# Patient Record
Sex: Male | Born: 1965 | Race: White | Hispanic: No | Marital: Married | Smoking: Never smoker
Health system: Southern US, Community
[De-identification: ages and names within clinical notes are randomized; demographics above are authoritative.]

## PROBLEM LIST (undated history)

## (undated) DIAGNOSIS — E7800 Pure hypercholesterolemia, unspecified: Secondary | ICD-10-CM

## (undated) DIAGNOSIS — I10 Essential (primary) hypertension: Secondary | ICD-10-CM

## (undated) HISTORY — PX: HERNIA REPAIR: SHX51

## (undated) HISTORY — PX: OTHER SURGICAL HISTORY: SHX169

---

## 2007-06-30 ENCOUNTER — Ambulatory Visit: Payer: Self-pay | Admitting: Emergency Medicine

## 2007-10-12 ENCOUNTER — Ambulatory Visit: Payer: Self-pay | Admitting: Internal Medicine

## 2008-02-24 IMAGING — CR RIGHT ELBOW - COMPLETE 3+ VIEW
1 series · 4 of 4 positions shown · non-contrast
Comparison: none

REASON FOR EXAM: swollen painful right elbow twisted
COMMENTS:   LMP: (Male)

PROCEDURE:     MDR - MDR ELBOW RT COMP W/ OBLIQUES  - June 30, 2007  [DATE]
RESULT:     No fracture, dislocation or other acute bony abnormality is
identified. Incidentally noted is a posterior calcaneal spur compatible with
calcification at tendon insertion.

[Series 1: view not recorded · 0.17mm/px · 4 of 4 slices shown]
[im 1/4]
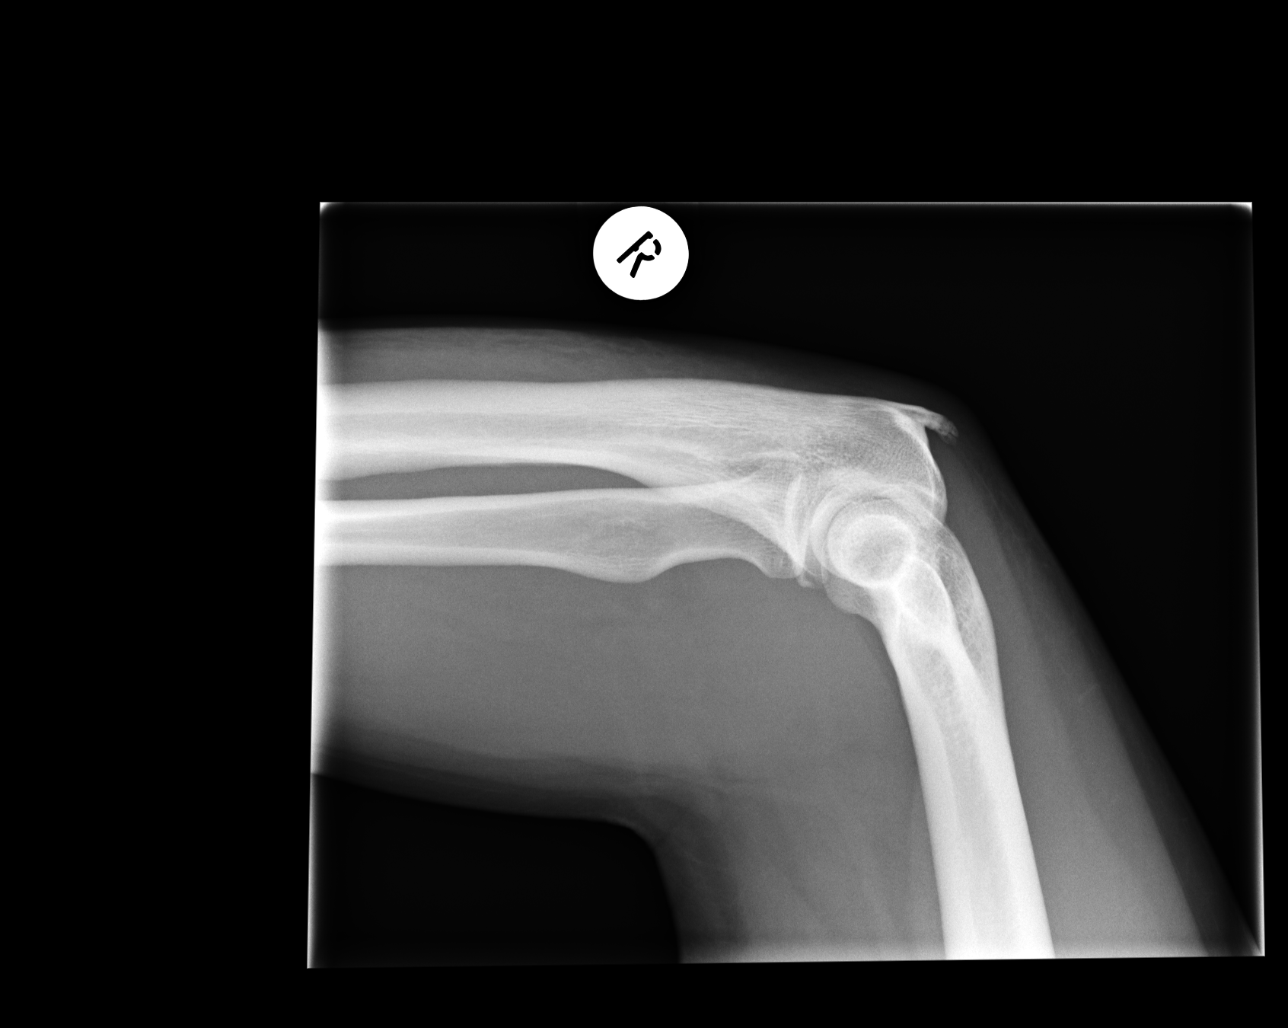
[im 2/4]
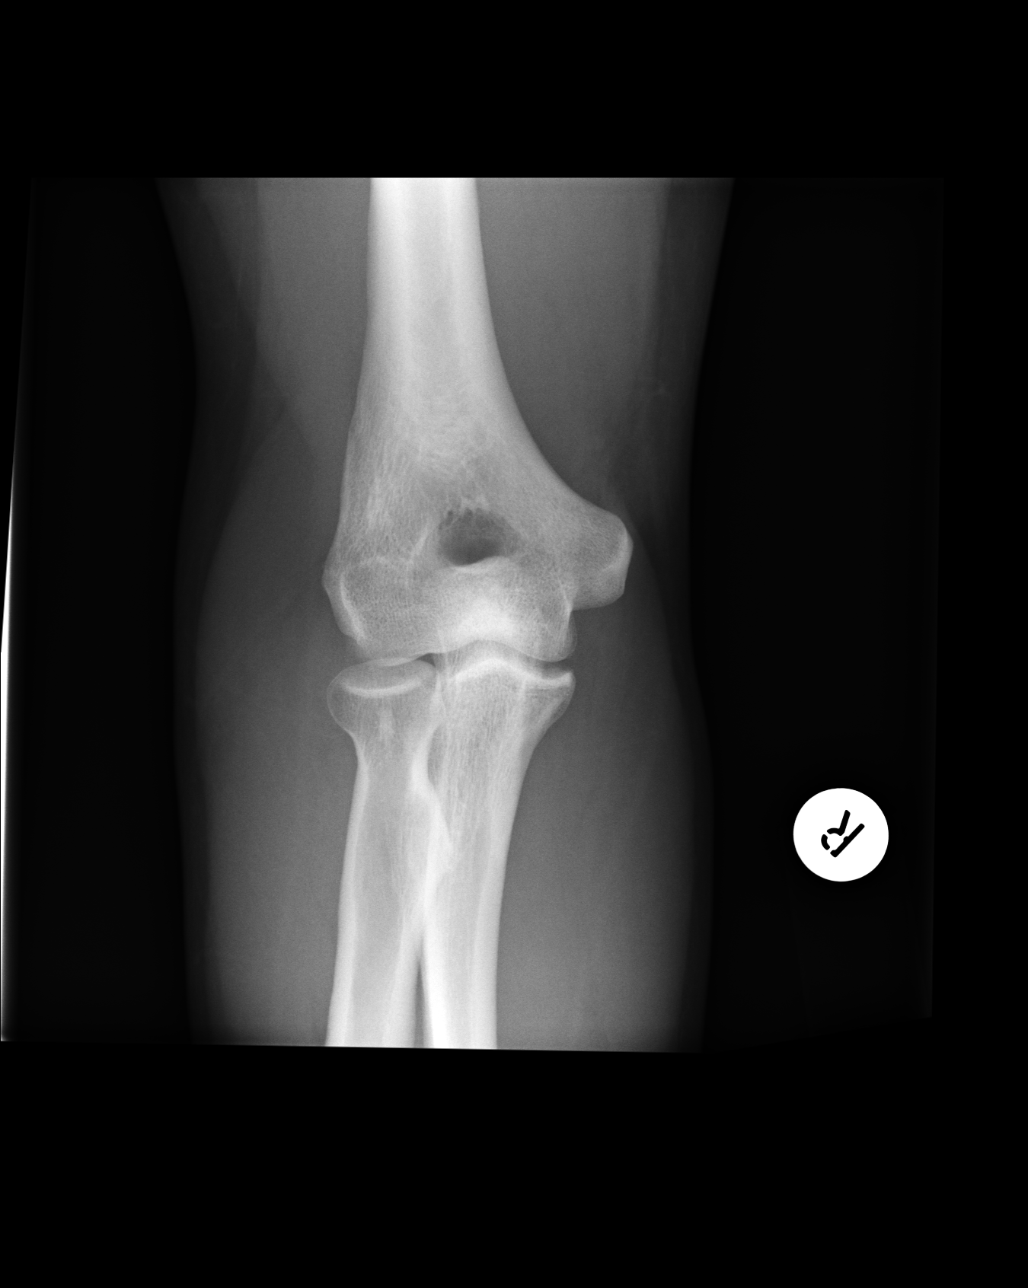
[im 3/4]
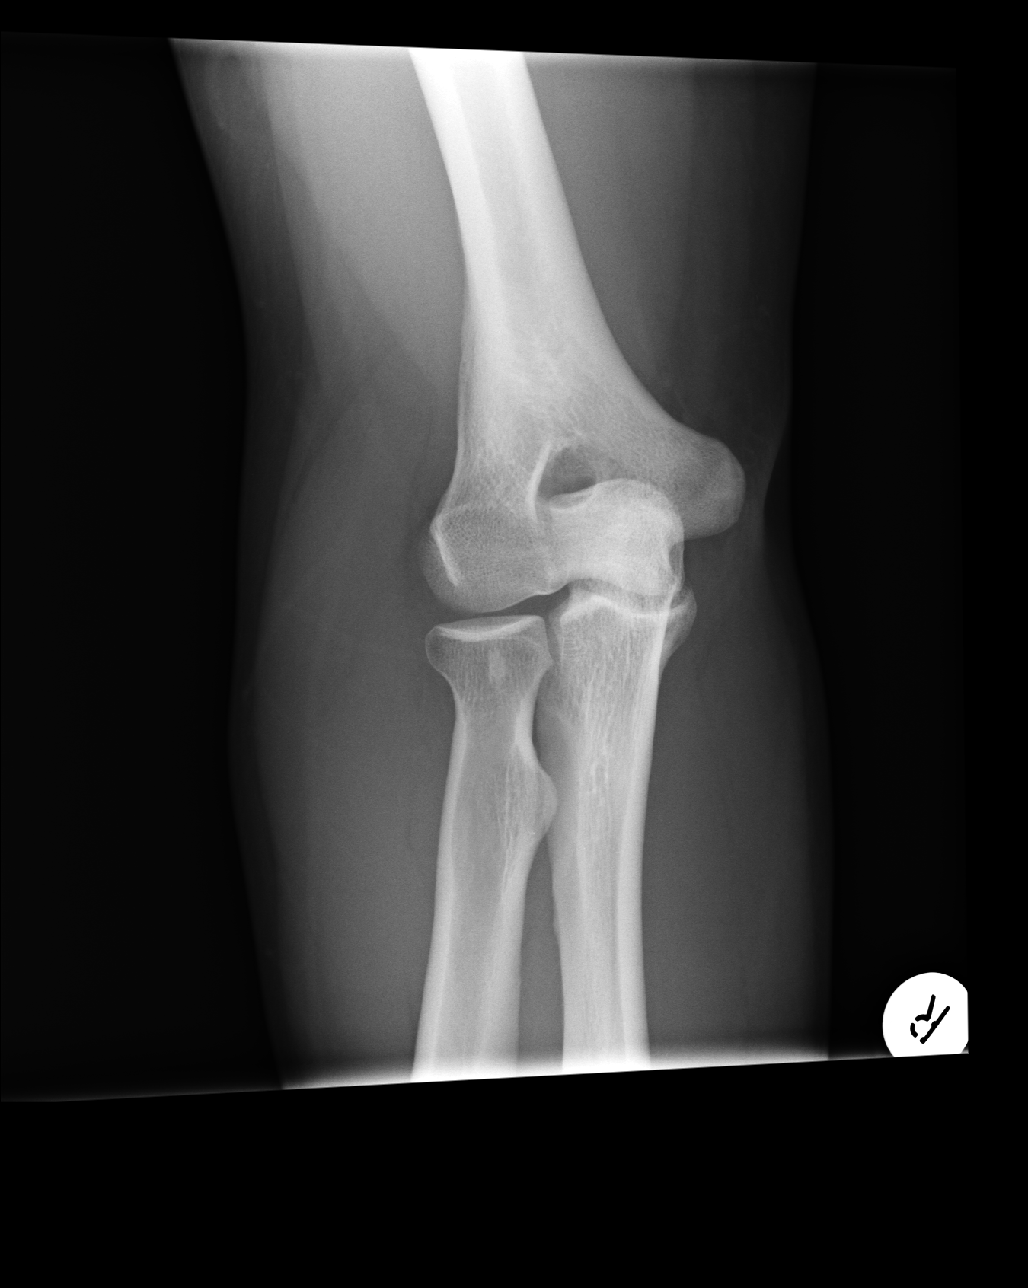
[im 4/4]
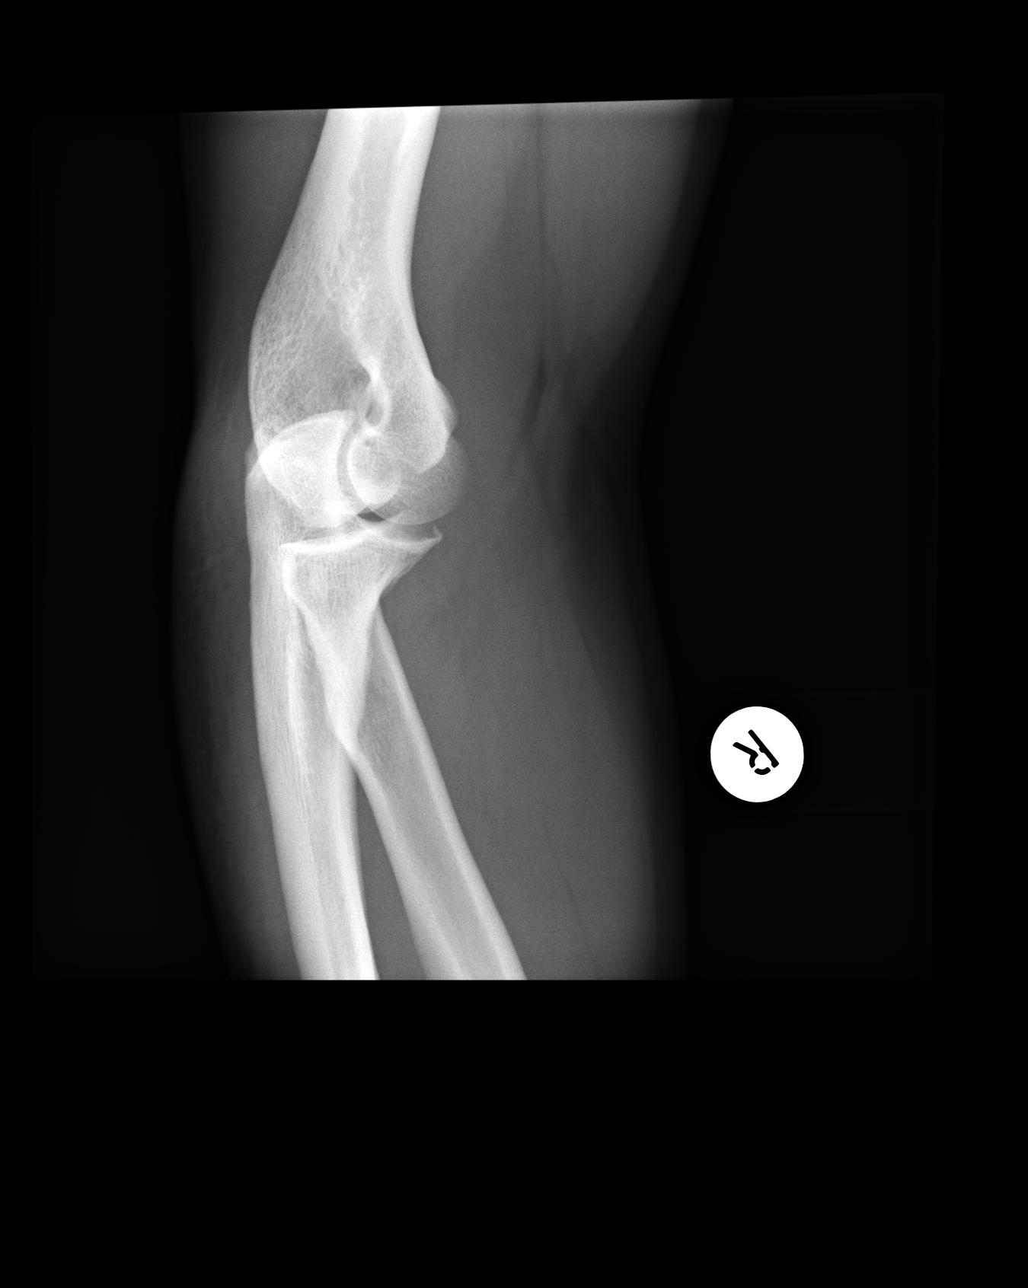

[4 of 4 positions shown; findings below may reference images not displayed]

IMPRESSION: 1.     No acute bony abnormalities are noted.

## 2011-12-31 DIAGNOSIS — F329 Major depressive disorder, single episode, unspecified: Secondary | ICD-10-CM | POA: Insufficient documentation

## 2011-12-31 DIAGNOSIS — F1010 Alcohol abuse, uncomplicated: Secondary | ICD-10-CM | POA: Insufficient documentation

## 2012-03-19 ENCOUNTER — Ambulatory Visit: Payer: Self-pay | Admitting: Urology

## 2012-11-28 DIAGNOSIS — E669 Obesity, unspecified: Secondary | ICD-10-CM | POA: Insufficient documentation

## 2012-11-28 DIAGNOSIS — N400 Enlarged prostate without lower urinary tract symptoms: Secondary | ICD-10-CM | POA: Insufficient documentation

## 2012-12-12 ENCOUNTER — Ambulatory Visit: Payer: Self-pay | Admitting: Urology

## 2013-03-16 DIAGNOSIS — R079 Chest pain, unspecified: Secondary | ICD-10-CM | POA: Insufficient documentation

## 2013-11-30 ENCOUNTER — Other Ambulatory Visit: Payer: Self-pay

## 2013-12-05 LAB — CULTURE, BLOOD (SINGLE)

## 2014-06-04 DIAGNOSIS — I10 Essential (primary) hypertension: Secondary | ICD-10-CM | POA: Insufficient documentation

## 2014-06-04 DIAGNOSIS — R748 Abnormal levels of other serum enzymes: Secondary | ICD-10-CM | POA: Insufficient documentation

## 2014-10-05 DIAGNOSIS — M1A0720 Idiopathic chronic gout, left ankle and foot, without tophus (tophi): Secondary | ICD-10-CM | POA: Insufficient documentation

## 2015-02-25 NOTE — H&P (Signed)
PATIENT NAME:  Jimmy Noble, Jimmy Noble MR#:  [id number] DATE OF BIRTH:  15-Jun-1966  DATE OF ADMISSION:  12/12/2012  HISTORY OF PRESENT ILLNESS:  The patient presents with urgency, frequency and hesitation of urination, difficulty voiding, change in urinary habits.  These are symptoms of his previous meatal stenosis and he has chronic meatal stenosis, which he has failed to come in for correction.  He was seen by Dr. Ernst Spell 1 year ago, had a cystoscopy. Urinalysis today revealed red blood cells and some bacteria.  He has been on antibiotics.  He feels fatigued.  Otherwise, he has no shortness of breath, chest pain. He has no difficulty ambulating.  He has no other problems of swallowing.    REVIEW OF SYSTEMS:  HEENT:  His sight is good, hearing is good.  He has no nasal stuffiness.   CHEST:  He has no chest pain, shortness of breath on exertion.  LUNGS:  He has no COPD, no hemoptysis.   ABDOMEN:  He has no abdominal pain, constipation, diarrhea, vomiting.  PENIS:  The same as the HPI.   SKIN:  He has no history of skin lesions, burns or surgery for skin cancer.    PHYSICAL EXAMINATION: VITAL SIGNS:  Weight 251, pulse 90, BP 125/85.  GENERAL:  Well-developed, well-nourished, in no acute distress.  Alert and oriented x 3.  NECK:  Supple and no palpable masses.  Normal thyroid size and consistency.  No thyroid masses or tenderness.   RESPIRATORY:  Effort is quiet even with the use of accessory muscles on auscultation of the lungs.  Normal breath sounds and no rales, rhonchi or wheezing.  CARDIAC:  Heart sounds are regular rate and rhythm.  There are no gross murmurs, rubs or thrills.  PENIS:  Circumcised, no warts.  Severe meatal stenosis easily seen.    PLAN:  He was placed on Ceftin 250 mg b.i.d.  I will do a meatotomy in the morning.  I will not do a cystoscopy because he had one done and he has some bacteria in his urine.  I think discretion is the better part of valor on this case.  He has no history of  stricture or disease on recent cystoscopy in June of last year.  I have explained the procedure to the patient and that he will have 2 sutures laterally, probably 3-0 Vicryl.  These will stay in for at least a week.  He will be seen in followup 10 days postop.  We will remove the sutures then if they have not come out.  A generous incision will be made in the ventral portion of his urethra.  He understands this.  He understands that he will asleep for this procedure.    ____________________________ Janice Coffin. Elnoria Howard, [place] rdh:cc D: 12/11/2012 15:25:57 ET T: 12/11/2012 17:00:12 ET JOB#: [id number]  cc: Janice Coffin. Elnoria Howard, DO, <Dictator> RICHARD D HART DO ELECTRONICALLY SIGNED 12/26/2012 15:44

## 2015-02-25 NOTE — Op Note (Signed)
PATIENT NAME:  Jimmy Noble, Jimmy Noble MR#:  [id number] DATE OF BIRTH:  07-31-1966  DATE OF PROCEDURE:  12/12/2012  PREOPERATIVE DIAGNOSIS:  Severe urethral meatal stenosis.  POSTOPERATIVE DIAGNOSIS:  Severe urethral meatal stenosis.  PROCEDURE:  Urethral meatotomy.    ANESTHESIA:  General.  COMPLICATIONS:  None.  TECHNIQUE:  With the patient sterilely prepped and draped in the supine position, the penis is anesthetized in the ventral position utilizing 1% Xylocaine.  In this anesthetized portion ventrally I am able to clamp the anesthetized area at the ventral portion of the lower portion of the urethral meatus and placed 2 silk sutures laterally along the clamp which is a very nice elongated pointed needle driver for proper pressure on the glans tissue.  The sutures are 4-0 Vicryl and they are sutured laterally.  Then I take the clamp off, held the urethra open and incised the now clamped portion.  There is a little more bleeding than I usually expect.  I cauterized that easily.  The patient sent to recovery in satisfactory condition with the sutures holding the meatus open laterally.  2% lidocaine gel was placed on the wound areas for decrease in the pain on voiding.  Then he is able to be sent to recovery in satisfactory condition.    ____________________________ Janice Coffin. Elnoria Howard, DO rdh:ea D: 12/12/2012 14:28:43 ET T: 12/13/2012 03:29:27 ET JOB#: [id number]  cc: Janice Coffin. Lynnell Chad, <Dictator> Outside Physician Dayanna Pryce D Alphonso Gregson DO ELECTRONICALLY SIGNED 12/26/2012 15:44

## 2016-04-16 ENCOUNTER — Ambulatory Visit
Admission: EM | Admit: 2016-04-16 | Discharge: 2016-04-16 | Discharge status: Absent without leave | Payer: BLUE CROSS/BLUE SHIELD

## 2016-05-29 ENCOUNTER — Ambulatory Visit (INDEPENDENT_AMBULATORY_CARE_PROVIDER_SITE_OTHER): Payer: BLUE CROSS/BLUE SHIELD | Admitting: Urology

## 2016-05-29 ENCOUNTER — Encounter: Payer: Self-pay | Admitting: Urology

## 2016-05-29 VITALS — BP 144/84 | HR 76 | Ht 72.0 in | Wt 239.1 lb

## 2016-05-29 DIAGNOSIS — N411 Chronic prostatitis: Secondary | ICD-10-CM | POA: Diagnosis not present

## 2016-05-29 LAB — BLADDER SCAN AMB NON-IMAGING: SCAN RESULT: 22

## 2016-05-29 NOTE — Progress Notes (Signed)
   05/29/2016 3:13 PM   Katheren Puller 12-04-1965 [id number]  Referring provider: No referring provider defined for this encounter.  Chief Complaint  Patient presents with  . Follow-up    prostatitis    HPI: Jimmy Noble is a 50yo seen today for recurrent prostatitis.  He has had 2 episodes of acute prostatitis in the past 6 months. He saw his PCP who recently placed him on cipro and flomax. He stopped the medications due to lightheadedness and nausea. He has urgency, frequency, nocturia 3-4x, feeling of incomplete emptying.  He drinks 20oz within 2 hours of bedtime.  He has a hx of meatal stenosis and underwent meatotomy by Dr. Elnoria Howard in 2014.    PMH: No past medical history on file.  Surgical History: No past surgical history on file.  Home Medications:    Medication List       Accurate as of 05/29/16  3:13 PM. Always use your most recent med list.          atorvastatin 10 MG tablet Commonly known as:  LIPITOR   citalopram 20 MG tablet Commonly known as:  CELEXA TAKE 1/2 TABLET(10 MG) BY MOUTH EVERY DAY   lisinopril 10 MG tablet Commonly known as:  PRINIVIL,ZESTRIL Take by mouth.       Allergies: No Known Allergies  Family History: No family history on file.  Social History:  reports that he has never smoked. He does not have any smokeless tobacco history on file. He reports that he drinks alcohol. He reports that he does not use drugs.  ROS:                                        Physical Exam: BP (!) 144/84 (BP Location: Right Arm, Patient Position: Sitting, Cuff Size: Large)   Pulse 76   Ht 6' (1.829 m)   Wt 108.5 kg (239 lb 1.6 oz)   BMI 32.43 kg/m   Constitutional:  Alert and oriented, No acute distress. HEENT: [place] AT, moist mucus membranes.  Trachea midline, no masses. Cardiovascular: No clubbing, cyanosis, or edema. Respiratory: Normal respiratory effort, no increased work of breathing. GI: Abdomen is soft, nontender,  nondistended, no abdominal masses GU: No CVA tenderness. Circumcised phallus, mild meatal stenosis. No masses/lesions on penis, testes, scrotum. Prostate 30g smooth, tender, boggy. Skin: No rashes, bruises or suspicious lesions. Lymph: No cervical or inguinal adenopathy. Neurologic: Grossly intact, no focal deficits, moving all 4 extremities. Psychiatric: Normal mood and affect.  Laboratory Data: No results found for: WBC, HGB, HCT, MCV, PLT  No results found for: CREATININE  No results found for: PSA  No results found for: TESTOSTERONE  No results found for: HGBA1C  Urinalysis No results found for: COLORURINE, APPEARANCEUR, LABSPEC, PHURINE, GLUCOSEU, HGBUR, BILIRUBINUR, KETONESUR, PROTEINUR, UROBILINOGEN, NITRITE, LEUKOCYTESUR  Pertinent Imaging: none  Assessment & Plan:    1. Chronic prostatitis -bactrim DS BID for 30 days - rapaflo 8mg  qhs - Urinalysis, Complete -RTC 1 month   No Follow-up on file.  Nicolette Bang, MD  [facility] [address] [place],  [address] [phone]

## 2016-05-30 LAB — URINALYSIS, COMPLETE
BILIRUBIN UA: NEGATIVE
Glucose, UA: NEGATIVE
Ketones, UA: NEGATIVE
Nitrite, UA: NEGATIVE
PH UA: 5.5 (ref 5.0–7.5)
Specific Gravity, UA: >1.03 — ABNORMAL HIGH (ref 1.005–1.030)
UUROB: 0.2 mg/dL (ref 0.2–1.0)

## 2016-05-30 LAB — MICROSCOPIC EXAMINATION: Epithelial Cells (non renal): NONE SEEN /hpf (ref 0–10)

## 2016-05-31 LAB — URINE CULTURE

## 2016-06-04 MED ORDER — SULFAMETHOXAZOLE-TRIMETHOPRIM 800-160 MG PO TABS: 1.0000 | ORAL_TABLET | Freq: Two times a day (BID) | ORAL | 0 refills | Status: DC

## 2016-06-04 NOTE — Addendum Note (Signed)
Addended by: Wilson Singer on: 06/04/2016 11:46 AM   Modules accepted: Orders

## 2016-06-07 ENCOUNTER — Other Ambulatory Visit: Payer: Self-pay

## 2016-06-07 DIAGNOSIS — N410 Acute prostatitis: Secondary | ICD-10-CM

## 2016-06-07 MED ORDER — SULFAMETHOXAZOLE-TRIMETHOPRIM 800-160 MG PO TABS: 1.0000 | ORAL_TABLET | Freq: Two times a day (BID) | ORAL | 0 refills | Status: AC

## 2016-06-07 NOTE — Progress Notes (Signed)
Pt wife called stating pt went out of town and forgot abx. Wife requested a 4 day supply be sent to a local pharmacy in [place]. 4 days were sent to [facility]. Wife voiced understanding.

## 2016-07-03 ENCOUNTER — Ambulatory Visit (INDEPENDENT_AMBULATORY_CARE_PROVIDER_SITE_OTHER): Payer: BLUE CROSS/BLUE SHIELD | Admitting: Urology

## 2016-07-03 VITALS — BP 144/92 | HR 83 | Ht 72.0 in | Wt 237.0 lb

## 2016-07-03 DIAGNOSIS — N411 Chronic prostatitis: Secondary | ICD-10-CM | POA: Insufficient documentation

## 2016-07-03 LAB — URINALYSIS, COMPLETE
Bilirubin, UA: NEGATIVE
GLUCOSE, UA: NEGATIVE
KETONES UA: NEGATIVE
Nitrite, UA: NEGATIVE
SPEC GRAV UA: 1.025 (ref 1.005–1.030)
Urobilinogen, Ur: 0.2 mg/dL (ref 0.2–1.0)
pH, UA: 5.5 (ref 5.0–7.5)

## 2016-07-03 LAB — MICROSCOPIC EXAMINATION
Bacteria, UA: NONE SEEN
Epithelial Cells (non renal): NONE SEEN /hpf (ref 0–10)

## 2016-07-03 NOTE — Progress Notes (Signed)
07/03/2016 10:11 AM   Jimmy Noble [birth year] [id number]  Referring provider: Sofie Hartigan, MD [place], [place] [address]  Chief Complaint  Patient presents with  . Follow-up    65month - prostatitis    HPI: Jimmy Noble is a 50yo here for followup for recurent prostatitis. He was previously seen by Dr. Elnoria Howard for meatal stenosis. He was previously on cipro and flomax for prostatitis which did not improve his symptoms. He was then placed on bactrim for 30 days and rapaflo 8mg  qhs at his last visit.  He stopped rapaflo 4 days ago.    UA today is normal   PMH: No past medical history on file.  Surgical History: No past surgical history on file.  Home Medications:    Medication List       Accurate as of 07/03/16 10:11 AM. Always use your most recent med list.          atorvastatin 10 MG tablet Commonly known as:  LIPITOR   citalopram 20 MG tablet Commonly known as:  CELEXA TAKE 1/2 TABLET(10 MG) BY MOUTH EVERY DAY   lisinopril 10 MG tablet Commonly known as:  PRINIVIL,ZESTRIL Take by mouth.       Allergies: No Known Allergies  Family History: No family history on file.  Social History:  reports that he has never smoked. He does not have any smokeless tobacco history on file. He reports that he drinks alcohol. He reports that he does not use drugs.  ROS: UROLOGY Frequent Urination?: No Hard to postpone urination?: No Burning/pain with urination?: No Get up at night to urinate?: No Leakage of urine?: No Urine stream starts and stops?: No Trouble starting stream?: No Do you have to strain to urinate?: No Blood in urine?: No Urinary tract infection?: No Sexually transmitted disease?: No Injury to kidneys or bladder?: No Painful intercourse?: No Weak stream?: No Erection problems?: No Penile pain?: No  Gastrointestinal Nausea?: No Vomiting?: No Indigestion/heartburn?: No Diarrhea?:  No Constipation?: No  Constitutional Fever: No Night sweats?: No Weight loss?: No Fatigue?: No  Skin Skin rash/lesions?: Yes Itching?: No  Eyes Blurred vision?: No Double vision?: No  Ears/Nose/Throat Sore throat?: No Sinus problems?: No  Hematologic/Lymphatic Swollen glands?: No Easy bruising?: No  Cardiovascular Leg swelling?: No Chest pain?: No  Respiratory Cough?: No Shortness of breath?: No  Endocrine Excessive thirst?: No  Musculoskeletal Back pain?: No Joint pain?: No  Neurological Headaches?: No Dizziness?: No  Psychologic Depression?: Yes Anxiety?: No  Physical Exam: BP (!) 144/92   Pulse 83   Ht 6' (1.829 m)   Wt 107.5 kg (237 lb)   BMI 32.14 kg/m   Constitutional:  Alert and oriented, No acute distress. HEENT: [place] AT, moist mucus membranes.  Trachea midline, no masses. Cardiovascular: No clubbing, cyanosis, or edema. Respiratory: Normal respiratory effort, no increased work of breathing. GI: Abdomen is soft, nontender, nondistended, no abdominal masses GU: No CVA tenderness. Skin: No rashes, bruises or suspicious lesions. Lymph: No cervical or inguinal adenopathy. Neurologic: Grossly intact, no focal deficits, moving all 4 extremities. Psychiatric: Normal mood and affect.  Laboratory Data: No results found for: WBC, HGB, HCT, MCV, PLT  No results found for: CREATININE  No results found for: PSA  No results found for: TESTOSTERONE  No results found for: HGBA1C  Urinalysis    Component Value Date/Time   APPEARANCEUR Haz[place] (A) 05/29/2016 1518   GLUCOSEU Negative 05/29/2016 1518  BILIRUBINUR Negative 05/29/2016 1518   PROTEINUR 2+ (A) 05/29/2016 1518   NITRITE Negative 05/29/2016 1518   LEUKOCYTESUR 1+ (A) 05/29/2016 1518    Pertinent Imaging: none  Assessment & Plan:    1. Chronic prostatitis -continue timed voiding -rtc 3 months - Urinalysis, Complete   No Follow-up on file.  Nicolette Bang,  MD  [facility] [address], [place], [place] [address] [phone]

## 2016-09-25 ENCOUNTER — Ambulatory Visit: Payer: BLUE CROSS/BLUE SHIELD

## 2017-12-26 ENCOUNTER — Encounter: Payer: Self-pay | Admitting: Urology

## 2017-12-26 ENCOUNTER — Ambulatory Visit: Payer: BLUE CROSS/BLUE SHIELD | Admitting: Urology

## 2017-12-26 VITALS — BP 148/90 | HR 80 | Ht 72.0 in | Wt 248.1 lb

## 2017-12-26 DIAGNOSIS — N3281 Overactive bladder: Secondary | ICD-10-CM | POA: Diagnosis not present

## 2017-12-26 MED ORDER — MIRABEGRON ER 25 MG PO TB24: 25.0000 mg | ORAL_TABLET | Freq: Every day | ORAL | 11 refills | Status: DC

## 2017-12-26 NOTE — Progress Notes (Signed)
12/26/2017 9:01 AM   Jimmy Noble 1966/06/02 [id number]  Referring provider: Sofie Hartigan, [place], [place] [address]  No chief complaint on file.   HPI: The patient is a 52 year old gentleman who is been seen in our office before for recurrent prostatitis and meatal stenosis status post meatotomy by Dr. Elnoria Howard in 2014  who presents to discuss recent bout of prostatitis.  He was seen recently in the emergency department for flank pain.  He was also having urinary symptoms at that time with an elevated white blood cell count of 16.  He has been on Cipro since November 13, 2017.  He had negative urine culture December 25, 2017.  Since January, the patient states that he has felt "crappy."  His biggest complaint is sweats, diffuse upper back pain, and not feeling well overall.  He denies any change in his urinary symptoms.    He does have baseline urinary symptoms for the last 3 years which include nocturia x5 and urinary urgency with incontinence.  He does feel that he does not empty his bladder.  He has a weak stream.  He he notes frequency as well.  However, he has had no changes to his urinary status during his recent episode of not feeling well.  He has no dysuria.  He has no suprapubic discomfort.  He has no pain in his groin.  PVR: 0 cc   PMH: History reviewed. No pertinent past medical history.  Surgical History: Past Surgical History:  Procedure Laterality Date  . DVIU      Home Medications:  Allergies as of 12/26/2017   No Known Allergies     Medication List        Accurate as of 12/26/17  9:01 AM. Always use your most recent med list.          atorvastatin 10 MG tablet Commonly known as:  LIPITOR   ciprofloxacin 500 MG tablet Commonly known as:  CIPRO Take 500 mg by mouth 2 (two) times daily.   citalopram 20 MG tablet Commonly known as:  CELEXA TAKE 1/2 TABLET(10 MG) BY MOUTH EVERY DAY   lisinopril 10 MG tablet Commonly known as:   PRINIVIL,ZESTRIL Take by mouth.   tamsulosin 0.4 MG Caps capsule Commonly known as:  FLOMAX Take 0.4 mg by mouth.       Allergies: No Known Allergies  Family History: Family History  Problem Relation Age of Onset  . Prostate cancer Neg Hx   . Bladder Cancer Neg Hx   . Kidney cancer Neg Hx     Social History:  reports that  has never smoked. he has never used smokeless tobacco. He reports that he drinks alcohol. He reports that he does not use drugs.  ROS: UROLOGY Frequent Urination?: No Hard to postpone urination?: No Burning/pain with urination?: No Get up at night to urinate?: No Leakage of urine?: No Urine stream starts and stops?: No Trouble starting stream?: No Do you have to strain to urinate?: No Blood in urine?: No Urinary tract infection?: No Sexually transmitted disease?: No Injury to kidneys or bladder?: No Painful intercourse?: No Weak stream?: No Erection problems?: No Penile pain?: No  Gastrointestinal Nausea?: Yes Vomiting?: No Indigestion/heartburn?: No Diarrhea?: No Constipation?: No  Constitutional Fever: No Night sweats?: Yes Weight loss?: No Fatigue?: No  Skin Skin rash/lesions?: No Itching?: No  Eyes Blurred vision?: Yes Double vision?: No  Ears/Nose/Throat Sore throat?: No Sinus problems?: No  Hematologic/Lymphatic Swollen glands?:  No Easy bruising?: No  Cardiovascular Leg swelling?: No Chest pain?: No  Respiratory Cough?: No Shortness of breath?: No  Endocrine Excessive thirst?: No  Musculoskeletal Back pain?: Yes Joint pain?: No  Neurological Headaches?: Yes Dizziness?: Yes  Psychologic Depression?: Yes Anxiety?: Yes  Physical Exam: BP (!) 148/90 (BP Location: Right Arm, Patient Position: Sitting, Cuff Size: Large)   Pulse 80   Ht 6' (1.829 m)   Wt 248 lb 1.6 oz (112.5 kg)   BMI 33.65 kg/m   Constitutional:  Alert and oriented, No acute distress. HEENT: [place] AT, moist mucus membranes.  Trachea  midline, no masses. Cardiovascular: No clubbing, cyanosis, or edema. Respiratory: Normal respiratory effort, no increased work of breathing. GI: Abdomen is soft, nontender, nondistended, no abdominal masses GU: No CVA tenderness.  DRE: 2+ smooth benign Skin: No rashes, bruises or suspicious lesions. Lymph: No cervical or inguinal adenopathy. Neurologic: Grossly intact, no focal deficits, moving all 4 extremities. Psychiatric: Normal mood and affect.  Laboratory Data: No results found for: WBC, HGB, HCT, MCV, PLT  No results found for: CREATININE  No results found for: PSA  No results found for: TESTOSTERONE  No results found for: HGBA1C  Urinalysis    Component Value Date/Time   APPEARANCEUR Clear 07/03/2016 0956   GLUCOSEU Negative 07/03/2016 0956   BILIRUBINUR Negative 07/03/2016 0956   PROTEINUR 2+ (A) 07/03/2016 0956   NITRITE Negative 07/03/2016 0956   LEUKOCYTESUR Trace (A) 07/03/2016 0956    Assessment & Plan:    1.  Overactive bladder The patient does have symptoms of long-standing overactive bladder.  We will try him on Myrbetriq 25 mg daily.  We can start him on an anticholinergic if this is cost prohibitive.  2.  Recurrent prostatitis The patient has no signs or symptoms suggesting current prostatitis  3.  Fatigue, sweats, diffuse back pain There is no urological explanation for this.  I recommended to the patient that he follow-up with his primary care provider to discuss further.  Return in about 3 months (around 03/25/2018).  Nickie Retort, MD  [facility] [address] [place], [place] [address] [phone]

## 2018-11-25 ENCOUNTER — Other Ambulatory Visit: Payer: Self-pay

## 2018-11-25 DIAGNOSIS — Z1211 Encounter for screening for malignant neoplasm of colon: Secondary | ICD-10-CM

## 2018-11-25 DIAGNOSIS — I10 Essential (primary) hypertension: Secondary | ICD-10-CM | POA: Diagnosis not present

## 2018-11-25 DIAGNOSIS — Z0000 Encounter for general adult medical examination without abnormal findings: Secondary | ICD-10-CM | POA: Diagnosis not present

## 2018-11-25 DIAGNOSIS — R7302 Impaired glucose tolerance (oral): Secondary | ICD-10-CM | POA: Diagnosis not present

## 2018-11-25 DIAGNOSIS — E7800 Pure hypercholesterolemia, unspecified: Secondary | ICD-10-CM | POA: Diagnosis not present

## 2018-11-25 MED ORDER — NA SULFATE-K SULFATE-MG SULF 17.5-3.13-1.6 GM/177ML PO SOLN: 1.0000 | Freq: Once | ORAL | 0 refills | Status: AC

## 2018-12-02 ENCOUNTER — Other Ambulatory Visit: Payer: Self-pay | Admitting: Family Medicine

## 2018-12-02 ENCOUNTER — Encounter: Payer: Self-pay | Admitting: *Deleted

## 2018-12-02 ENCOUNTER — Other Ambulatory Visit: Payer: Self-pay

## 2018-12-02 DIAGNOSIS — Z0000 Encounter for general adult medical examination without abnormal findings: Secondary | ICD-10-CM

## 2018-12-02 DIAGNOSIS — F1010 Alcohol abuse, uncomplicated: Secondary | ICD-10-CM

## 2018-12-04 NOTE — Discharge Instructions (Signed)
General Anesthesia, Adult, Care After  This sheet gives you information about how to care for yourself after your procedure. Your health care provider may also give you more specific instructions. If you have problems or questions, contact your health care provider.  What can I expect after the procedure?  After the procedure, the following side effects are common:  Pain or discomfort at the IV site.  Nausea.  Vomiting.  Sore throat.  Trouble concentrating.  Feeling cold or chills.  Weak or tired.  Sleepiness and fatigue.  Soreness and body aches. These side effects can affect parts of the body that were not involved in surgery.  Follow these instructions at home:    For at least 24 hours after the procedure:  Have a responsible adult stay with you. It is important to have someone help care for you until you are awake and alert.  Rest as needed.  Do not:  Participate in activities in which you could fall or become injured.  Drive.  Use heavy machinery.  Drink alcohol.  Take sleeping pills or medicines that cause drowsiness.  Make important decisions or sign legal documents.  Take care of children on your own.  Eating and drinking  Follow any instructions from your health care provider about eating or drinking restrictions.  When you feel hungry, start by eating small amounts of foods that are soft and easy to digest (bland), such as toast. Gradually return to your regular diet.  Drink enough fluid to keep your urine pale yellow.  If you vomit, rehydrate by drinking water, juice, or clear broth.  General instructions  If you have sleep apnea, surgery and certain medicines can increase your risk for breathing problems. Follow instructions from your health care provider about wearing your sleep device:  Anytime you are sleeping, including during daytime naps.  While taking prescription pain medicines, sleeping medicines, or medicines that make you drowsy.  Return to your normal activities as told by your health care  provider. Ask your health care provider what activities are safe for you.  Take over-the-counter and prescription medicines only as told by your health care provider.  If you smoke, do not smoke without supervision.  Keep all follow-up visits as told by your health care provider. This is important.  Contact a health care provider if:  You have nausea or vomiting that does not get better with medicine.  You cannot eat or drink without vomiting.  You have pain that does not get better with medicine.  You are unable to pass urine.  You develop a skin rash.  You have a fever.  You have redness around your IV site that gets worse.  Get help right away if:  You have difficulty breathing.  You have chest pain.  You have blood in your urine or stool, or you vomit blood.  Summary  After the procedure, it is common to have a sore throat or nausea. It is also common to feel tired.  Have a responsible adult stay with you for the first 24 hours after general anesthesia. It is important to have someone help care for you until you are awake and alert.  When you feel hungry, start by eating small amounts of foods that are soft and easy to digest (bland), such as toast. Gradually return to your regular diet.  Drink enough fluid to keep your urine pale yellow.  Return to your normal activities as told by your health care provider. Ask your health care   provider what activities are safe for you.  This information is not intended to replace advice given to you by your health care provider. Make sure you discuss any questions you have with your health care provider.  Document Released: 01/28/2001 Document Revised: 06/07/2017 Document Reviewed: 06/07/2017  Elsevier Interactive Patient Education  2019 [employer]

## 2018-12-05 ENCOUNTER — Ambulatory Visit
Admission: RE | Admit: 2018-12-05 | Discharge: 2018-12-05 | Disposition: A | Discharge status: Home / Self Care | Payer: 59 | Source: Ambulatory Visit | Attending: Gastroenterology | Admitting: Gastroenterology

## 2018-12-05 ENCOUNTER — Ambulatory Visit: Payer: 59 | Admitting: Anesthesiology

## 2018-12-05 ENCOUNTER — Encounter
Admission: RE | Disposition: A | Discharge status: Home / Self Care | Payer: Self-pay | Source: Ambulatory Visit | Attending: Gastroenterology

## 2018-12-05 DIAGNOSIS — D125 Benign neoplasm of sigmoid colon: Secondary | ICD-10-CM | POA: Diagnosis not present

## 2018-12-05 DIAGNOSIS — E7800 Pure hypercholesterolemia, unspecified: Secondary | ICD-10-CM | POA: Diagnosis not present

## 2018-12-05 DIAGNOSIS — Z79899 Other long term (current) drug therapy: Secondary | ICD-10-CM | POA: Insufficient documentation

## 2018-12-05 DIAGNOSIS — K621 Rectal polyp: Secondary | ICD-10-CM | POA: Insufficient documentation

## 2018-12-05 DIAGNOSIS — K635 Polyp of colon: Secondary | ICD-10-CM | POA: Insufficient documentation

## 2018-12-05 DIAGNOSIS — I10 Essential (primary) hypertension: Secondary | ICD-10-CM | POA: Insufficient documentation

## 2018-12-05 DIAGNOSIS — K641 Second degree hemorrhoids: Secondary | ICD-10-CM | POA: Diagnosis not present

## 2018-12-05 DIAGNOSIS — Z1211 Encounter for screening for malignant neoplasm of colon: Secondary | ICD-10-CM | POA: Diagnosis present

## 2018-12-05 HISTORY — PX: POLYPECTOMY: SHX5525

## 2018-12-05 HISTORY — PX: COLONOSCOPY WITH PROPOFOL: SHX5780

## 2018-12-05 HISTORY — DX: Pure hypercholesterolemia, unspecified: E78.00

## 2018-12-05 HISTORY — DX: Essential (primary) hypertension: I10

## 2018-12-05 SURGERY — COLONOSCOPY WITH PROPOFOL
Anesthesia: General | Site: Rectum

## 2018-12-05 MED ORDER — OXYCODONE HCL 5 MG PO TABS: 5.0000 mg | ORAL_TABLET | Freq: Once | ORAL | Status: DC | PRN

## 2018-12-05 MED ORDER — STERILE WATER FOR IRRIGATION IR SOLN
Status: DC | PRN
  Administered 2018-12-05: 10:00:00

## 2018-12-05 MED ORDER — LACTATED RINGERS IV SOLN: INTRAVENOUS | Status: DC

## 2018-12-05 MED ORDER — PROPOFOL 10 MG/ML IV BOLUS
INTRAVENOUS | Status: DC | PRN
  Administered 2018-12-05 (×3): 100 mg via INTRAVENOUS

## 2018-12-05 MED ORDER — OXYCODONE HCL 5 MG/5ML PO SOLN: 5.0000 mg | Freq: Once | ORAL | Status: DC | PRN

## 2018-12-05 MED ORDER — LIDOCAINE HCL (CARDIAC) PF 100 MG/5ML IV SOSY
PREFILLED_SYRINGE | INTRAVENOUS | Status: DC | PRN
  Administered 2018-12-05: 50 mg via INTRAVENOUS

## 2018-12-05 SURGICAL SUPPLY — 5 items
CANISTER SUCT 1200ML W/VALVE (MISCELLANEOUS) ×3 IMPLANT
GOWN CVR UNV OPN BCK APRN NK (MISCELLANEOUS) ×2 IMPLANT
GOWN ISOL THUMB LOOP REG UNIV (MISCELLANEOUS) ×4
KIT ENDO PROCEDURE OLY (KITS) ×3 IMPLANT
WATER STERILE IRR 250ML POUR (IV SOLUTION) ×3 IMPLANT

## 2018-12-05 NOTE — Anesthesia Postprocedure Evaluation (Signed)
Anesthesia Post Note  Patient: Jimmy Noble  Procedure(s) Performed: COLONOSCOPY WITH BIOPSIES (N/A Rectum) POLYPECTOMY (N/A Rectum)  Patient location during evaluation: PACU Anesthesia Type: General Level of consciousness: awake and alert Pain management: pain level controlled Vital Signs Assessment: post-procedure vital signs reviewed and stable Respiratory status: spontaneous breathing Cardiovascular status: blood pressure returned to baseline and stable Anesthetic complications: no    Jaci Standard, III,  Jeanette Rauth D

## 2018-12-05 NOTE — Anesthesia Procedure Notes (Signed)
Procedure Name: General with mask airway Performed by: Levern Kalka M, CRNA Pre-anesthesia Checklist: Timeout performed, Patient being monitored, Suction available, Emergency Drugs available and Patient identified Patient Re-evaluated:Patient Re-evaluated prior to induction Oxygen Delivery Method: Nasal cannula       

## 2018-12-05 NOTE — Anesthesia Preprocedure Evaluation (Addendum)
Anesthesia Evaluation  Patient identified by MRN, date of birth, ID band Patient awake    Reviewed: Allergy & Precautions, H&P , NPO status , Patient's Chart, lab work & pertinent test results  History of Anesthesia Complications Negative for: history of anesthetic complications  Airway Mallampati: II  TM Distance: >3 FB Neck ROM: full    Dental no notable dental hx.    Pulmonary neg pulmonary ROS,    Pulmonary exam normal breath sounds clear to auscultation       Cardiovascular hypertension, On Medications Normal cardiovascular exam Rhythm:regular Rate:Normal     Neuro/Psych negative neurological ROS     GI/Hepatic negative GI ROS, Neg liver ROS,   Endo/Other  negative endocrine ROS  Renal/GU negative Renal ROS  negative genitourinary   Musculoskeletal   Abdominal   Peds  Hematology negative hematology ROS (+)   Anesthesia Other Findings   Reproductive/Obstetrics                            Anesthesia Physical Anesthesia Plan  ASA: II  Anesthesia Plan: General   Post-op Pain Management:    Induction:   PONV Risk Score and Plan:   Airway Management Planned:   Additional Equipment:   Intra-op Plan:   Post-operative Plan:   Informed Consent: I have reviewed the patients History and Physical, chart, labs and discussed the procedure including the risks, benefits and alternatives for the proposed anesthesia with the patient or authorized representative who has indicated his/her understanding and acceptance.       Plan Discussed with:   Anesthesia Plan Comments:         Anesthesia Quick Evaluation

## 2018-12-05 NOTE — H&P (Signed)
Lucilla Lame, MD [address], [place] [address] Phone: [phone] Fax : [phone]  Primary Care Physician:  Sofie Hartigan, MD Primary Gastroenterologist:  Dr. Allen Norris  Pre-Procedure History & Physical: HPI:  Jimmy Noble is a 53 y.o. male is here for a screening colonoscopy.   Past Medical History:  Diagnosis Date  . Hypercholesteremia   . Hypertension     Past Surgical History:  Procedure Laterality Date  . DVIU      Prior to Admission medications   Medication Sig Start Date End Date Taking? Authorizing Provider  atorvastatin (LIPITOR) 10 MG tablet  04/05/16  Yes [provider]  citalopram (CELEXA) 20 MG tablet TAKE 1/2 TABLET(10 MG) BY MOUTH EVERY DAY 05/15/16  Yes [provider]  lisinopril (PRINIVIL,ZESTRIL) 10 MG tablet Take by mouth. 03/30/16  Yes [provider]  mirabegron ER (MYRBETRIQ) 25 MG TB24 tablet Take 1 tablet (25 mg total) by mouth daily. Patient not taking: Reported on 12/02/2018 12/26/17   Nickie Retort, MD    Allergies as of 11/25/2018  . (No Known Allergies)    Family History  Problem Relation Age of Onset  . Prostate cancer Neg Hx   . Bladder Cancer Neg Hx   . Kidney cancer Neg Hx     Social History   Socioeconomic History  . Marital status: Married    Spouse name: Not on file  . Number of children: Not on file  . Years of education: Not on file  . Highest education level: Not on file  Occupational History  . Not on file  Social Needs  . Financial resource strain: Not on file  . Food insecurity:    Worry: Not on file    Inability: Not on file  . Transportation needs:    Medical: Not on file    Non-medical: Not on file  Tobacco Use  . Smoking status: Never Smoker  . Smokeless tobacco: Never Used  Substance and Sexual Activity  . Alcohol use: Yes    Alcohol/week: 56.0 standard drinks    Types: 56 Cans of beer per week  . Drug use: No  . Sexual activity: Not on file   Lifestyle  . Physical activity:    Days per week: Not on file    Minutes per session: Not on file  . Stress: Not on file  Relationships  . Social connections:    Talks on phone: Not on file    Gets together: Not on file    Attends religious service: Not on file    Active member of club or organization: Not on file    Attends meetings of clubs or organizations: Not on file    Relationship status: Not on file  . Intimate partner violence:    Fear of current or ex partner: Not on file    Emotionally abused: Not on file    Physically abused: Not on file    Forced sexual activity: Not on file  Other Topics Concern  . Not on file  Social History Narrative  . Not on file    Review of Systems: See HPI, otherwise negative ROS  Physical Exam: BP 135/89   Pulse 92   Temp 97.7 F (36.5 C)   Ht 6' (1.829 m)   Wt 112 kg   SpO2 99%   BMI 33.50 kg/m  General:   Alert,  pleasant and cooperative in NAD Head:  Normocephalic and atraumatic. Neck:  Supple;  no masses or thyromegaly. Lungs:  Clear throughout to auscultation.    Heart:  Regular rate and rhythm. Abdomen:  Soft, nontender and nondistended. Normal bowel sounds, without guarding, and without rebound.   Neurologic:  Alert and  oriented x4;  grossly normal neurologically.  Impression/Plan: Jimmy Noble is now here to undergo a screening colonoscopy.  Risks, benefits, and alternatives regarding colonoscopy have been reviewed with the patient.  Questions have been answered.  All parties agreeable.

## 2018-12-05 NOTE — Op Note (Signed)
[facility] Gastroenterology Patient Name: Jimmy Noble Procedure Date: 12/05/2018 9:33 AM MRN: [id number] Account #: [id number] Date of Birth: 1966-09-20 Admit Type: Outpatient Age: 53 Room: [facility] OR ROOM 01 Gender: Male Note Status: Finalized Procedure:            Colonoscopy Indications:          Screening for colorectal malignant neoplasm Providers:            Lucilla Lame MD, MD Referring MD:         Sofie Hartigan (Referring MD) Medicines:            Propofol per Anesthesia Complications:        No immediate complications. Procedure:            Pre-Anesthesia Assessment:                       - Prior to the procedure, a History and Physical was                        performed, and patient medications and allergies were                        reviewed. The patient's tolerance of previous                        anesthesia was also reviewed. The risks and benefits of                        the procedure and the sedation options and risks were                        discussed with the patient. All questions were                        answered, and informed consent was obtained. Prior                        Anticoagulants: The patient has taken no previous                        anticoagulant or antiplatelet agents. ASA Grade                        Assessment: II - A patient with mild systemic disease.                        After reviewing the risks and benefits, the patient was                        deemed in satisfactory condition to undergo the                        procedure.                       After obtaining informed consent, the colonoscope was                        passed under direct vision. Throughout the procedure,  the patient's blood pressure, pulse, and oxygen                        saturations were monitored continuously. The was                        introduced through the anus and advanced to the the              cecum, identified by appendiceal orifice and ileocecal                        valve. The colonoscopy was performed without                        difficulty. The patient tolerated the procedure well.                        The quality of the bowel preparation was excellent. Findings:      The perianal and digital rectal examinations were normal.      A 3 mm polyp was found in the sigmoid colon. The polyp was sessile. The       polyp was removed with a cold biopsy forceps. Resection and retrieval       were complete.      A 4 mm polyp was found in the rectum. The polyp was sessile. The polyp       was removed with a cold biopsy forceps. Resection and retrieval were       complete.      Non-bleeding internal hemorrhoids were found during retroflexion. The       hemorrhoids were Grade II (internal hemorrhoids that prolapse but reduce       spontaneously). Impression:           - One 3 mm polyp in the sigmoid colon, removed with a                        cold biopsy forceps. Resected and retrieved.                       - One 4 mm polyp in the rectum, removed with a cold                        biopsy forceps. Resected and retrieved.                       - Non-bleeding internal hemorrhoids. Recommendation:       - Discharge patient to home.                       - Resume previous diet.                       - Continue present medications.                       - Await pathology results.                       - Repeat colonoscopy in 5 years if polyp adenoma and 10  years if hyperplastic Procedure Code(s):    --- Professional ---                       [phone], Colonoscopy, flexible; with biopsy, single or                        multiple Diagnosis Code(s):    --- Professional ---                       Z12.11, Encounter for screening for malignant neoplasm                        of colon                       D12.5, Benign neoplasm of sigmoid colon                        K62.1, Rectal polyp CPT copyright 2018 American Medical Association. All rights reserved. The codes documented in this report are preliminary and upon coder review may  be revised to meet current compliance requirements. Lucilla Lame MD, MD 12/05/2018 9:53:50 AM This report has been signed electronically. Number of Addenda: 0 Note Initiated On: 12/05/2018 9:33 AM Scope Withdrawal Time: 0 hours 8 minutes 19 seconds  Total Procedure Duration: 0 hours 9 minutes 49 seconds       [facility]

## 2018-12-05 NOTE — Transfer of Care (Signed)
Immediate Anesthesia Transfer of Care Note  Patient: Jimmy Noble  Procedure(s) Performed: COLONOSCOPY WITH BIOPSIES (N/A Rectum) POLYPECTOMY (N/A Rectum)  Patient Location: PACU  Anesthesia Type: General  Level of Consciousness: awake, alert  and patient cooperative  Airway and Oxygen Therapy: Patient Spontanous Breathing and Patient connected to supplemental oxygen  Post-op Assessment: Post-op Vital signs reviewed, Patient's Cardiovascular Status Stable, Respiratory Function Stable, Patent Airway and No signs of Nausea or vomiting  Post-op Vital Signs: Reviewed and stable  Complications: No apparent anesthesia complications

## 2018-12-08 ENCOUNTER — Encounter: Payer: Self-pay | Admitting: Gastroenterology

## 2018-12-10 ENCOUNTER — Ambulatory Visit
Admission: RE | Admit: 2018-12-10 | Discharge: 2018-12-10 | Disposition: A | Discharge status: Home / Self Care | Payer: 59 | Source: Ambulatory Visit | Attending: Family Medicine | Admitting: Family Medicine

## 2018-12-10 DIAGNOSIS — R160 Hepatomegaly, not elsewhere classified: Secondary | ICD-10-CM | POA: Diagnosis not present

## 2018-12-10 DIAGNOSIS — Z0000 Encounter for general adult medical examination without abnormal findings: Secondary | ICD-10-CM

## 2018-12-10 DIAGNOSIS — F1010 Alcohol abuse, uncomplicated: Secondary | ICD-10-CM | POA: Insufficient documentation

## 2019-03-09 DIAGNOSIS — J069 Acute upper respiratory infection, unspecified: Secondary | ICD-10-CM | POA: Diagnosis not present

## 2019-05-22 ENCOUNTER — Other Ambulatory Visit: Payer: Self-pay

## 2019-05-22 DIAGNOSIS — Z20822 Contact with and (suspected) exposure to covid-19: Secondary | ICD-10-CM

## 2019-05-27 LAB — NOVEL CORONAVIRUS, NAA: SARS-CoV-2, NAA: NOT DETECTED

## 2020-02-08 ENCOUNTER — Ambulatory Visit: Payer: 59 | Attending: Internal Medicine

## 2020-02-08 ENCOUNTER — Other Ambulatory Visit: Payer: Self-pay

## 2020-02-08 DIAGNOSIS — Z23 Encounter for immunization: Secondary | ICD-10-CM

## 2020-02-08 NOTE — Progress Notes (Signed)
   Covid-19 Vaccination Clinic  Name:  Jimmy Noble    MRN: [id number] DOB: 1966/03/18  02/08/2020  Mr. Jimmy Noble was observed post Covid-19 immunization for 15 minutes without incident. He was provided with Vaccine Information Sheet and instruction to access the V-Safe system.   Mr. Jimmy Noble was instructed to call 911 with any severe reactions post vaccine: Marland Kitchen Difficulty breathing  . Swelling of face and throat  . A fast heartbeat  . A bad rash all over body  . Dizziness and weakness   Immunizations Administered    Name Date Dose VIS Date Route   Pfizer COVID-19 Vaccine 02/08/2020  9:59 AM 0.3 mL 10/16/2019 Intramuscular   Manufacturer: [place]   Lot: [id number]   [place]: [id number]

## 2020-03-02 ENCOUNTER — Ambulatory Visit: Payer: 59 | Attending: Internal Medicine

## 2020-03-02 ENCOUNTER — Other Ambulatory Visit: Payer: Self-pay

## 2020-03-02 DIAGNOSIS — Z23 Encounter for immunization: Secondary | ICD-10-CM

## 2020-03-02 NOTE — Progress Notes (Signed)
   Covid-19 Vaccination Clinic  Name:  SRIANSH LIZARDI    MRN: [id number] DOB: 05/31/1966  03/02/2020  Mr. Bafford was observed post Covid-19 immunization for 15 minutes without incident. He was provided with Vaccine Information Sheet and instruction to access the V-Safe system.   Mr. Kempe was instructed to call 911 with any severe reactions post vaccine: Marland Kitchen Difficulty breathing  . Swelling of face and throat  . A fast heartbeat  . A bad rash all over body  . Dizziness and weakness   Immunizations Administered    Name Date Dose VIS Date Route   Pfizer COVID-19 Vaccine 03/02/2020  9:56 AM 0.3 mL 12/30/2018 Intramuscular   Manufacturer: [place]   Lot: [id number]   [place]: [id number]

## 2020-03-31 IMAGING — US US ABDOMEN LIMITED
1 series · 14 of 25 positions shown · non-contrast
Comparison: CT scan dated 03/19/2012

CLINICAL DATA: Alcohol abuse.  Hepatomegaly.

EXAM:
ULTRASOUND ABDOMEN LIMITED RIGHT UPPER QUADRANT

[Series 1: us abdomen limited · 0.27mm/px · 14 of 55 slices shown]
[im 1/55]
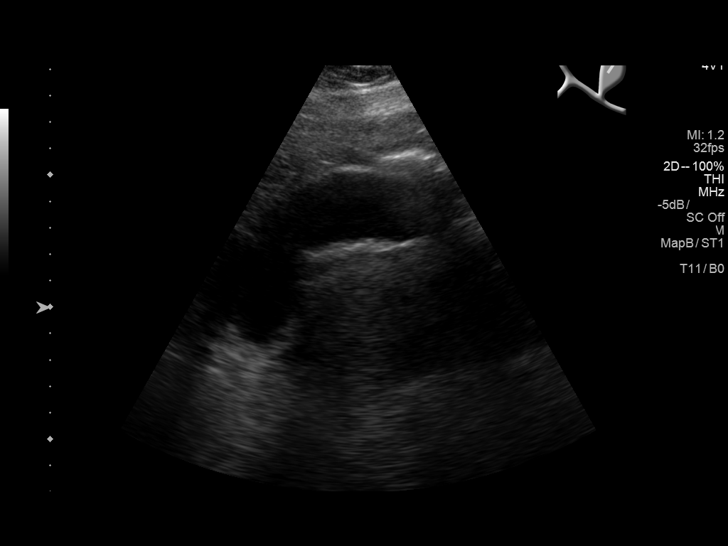
[im 5/55]
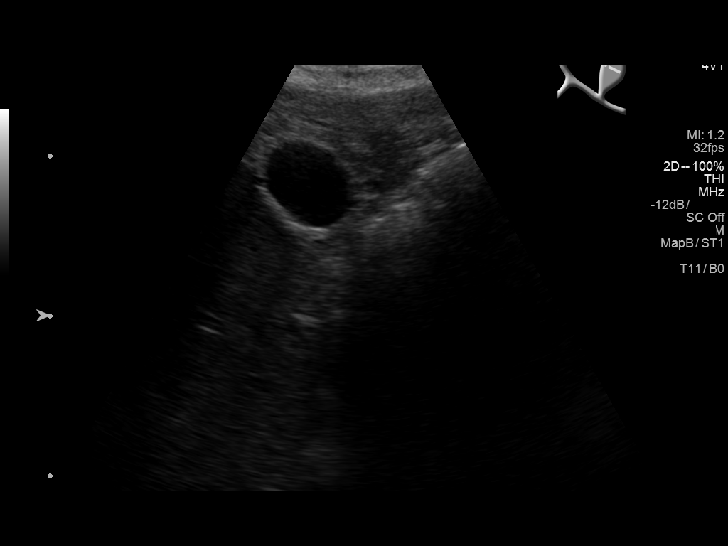
[im 10/55]
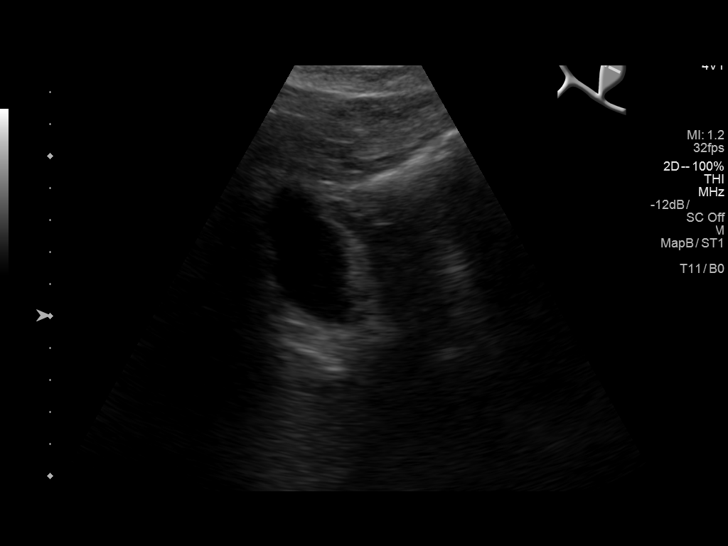
[im 14/55]
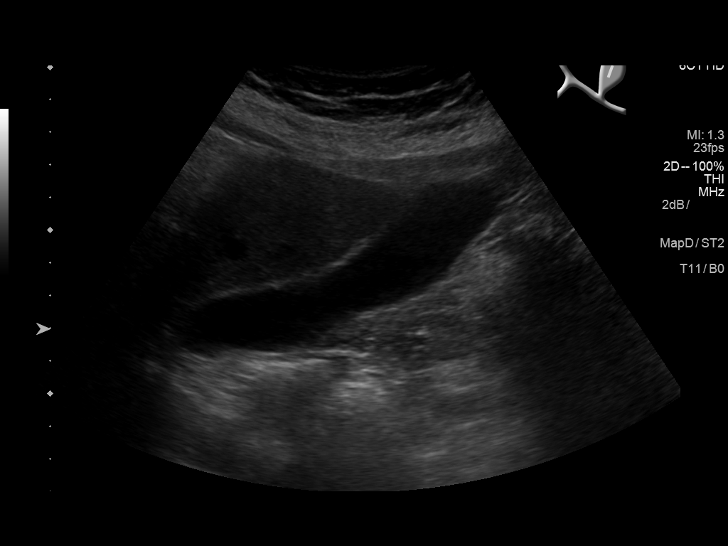
[im 19/55]
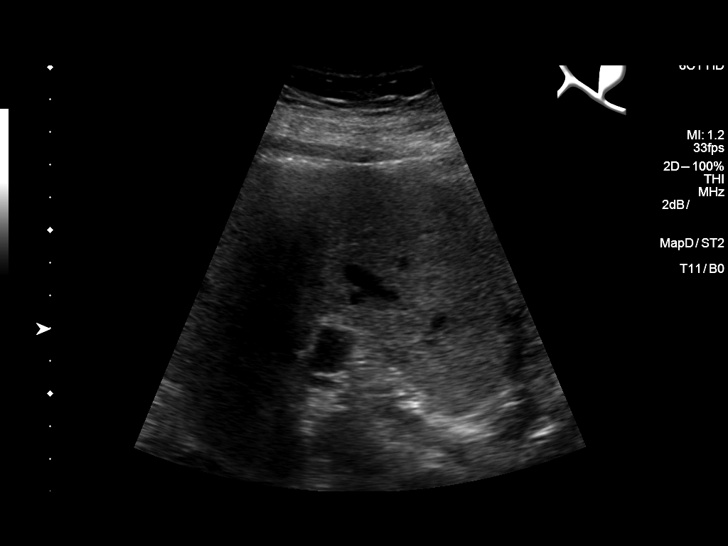
[im 21/55]
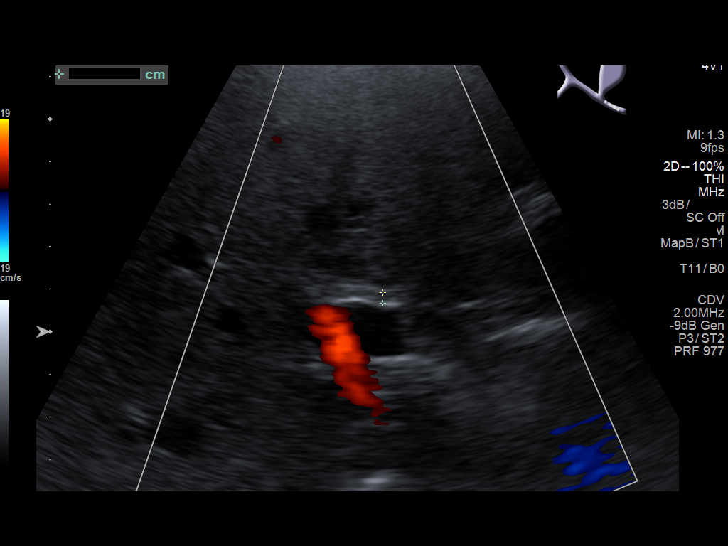
[im 25/55]
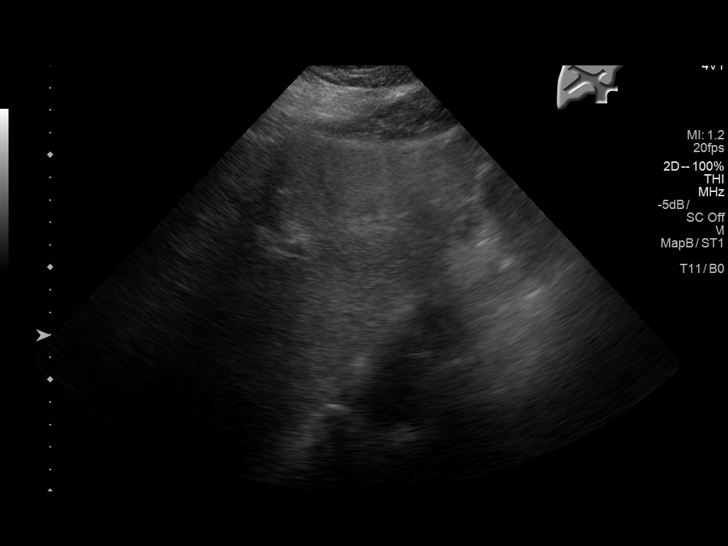
[im 30/55]
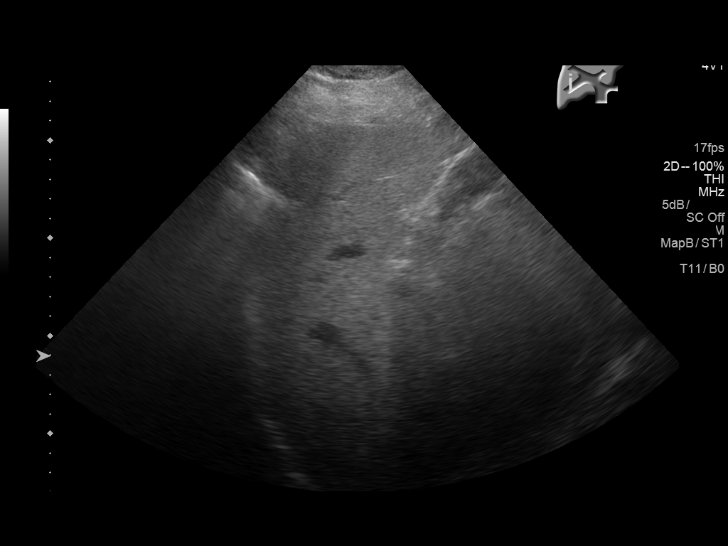
[im 34/55]
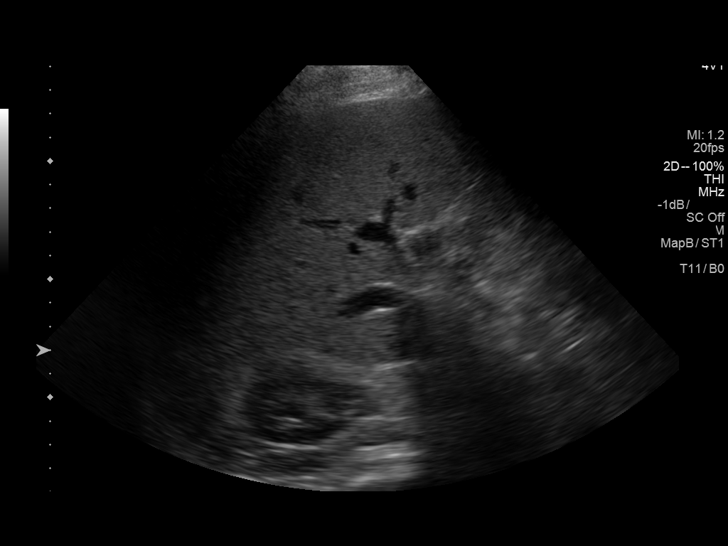
[im 37/55]
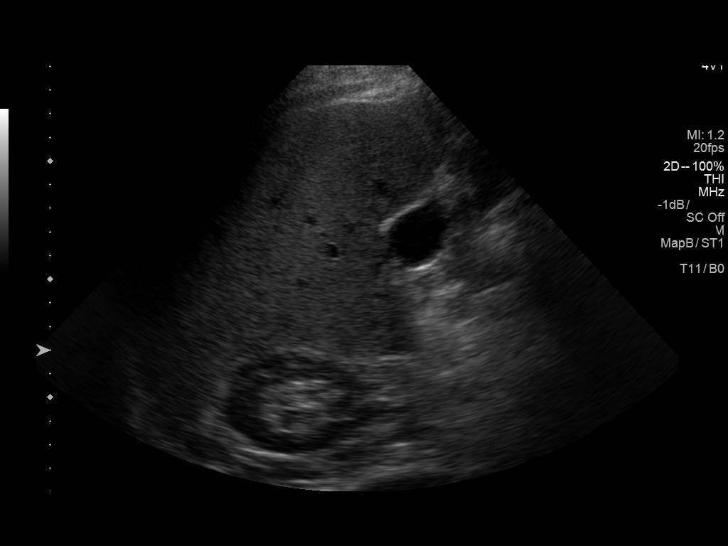
[im 41/55]
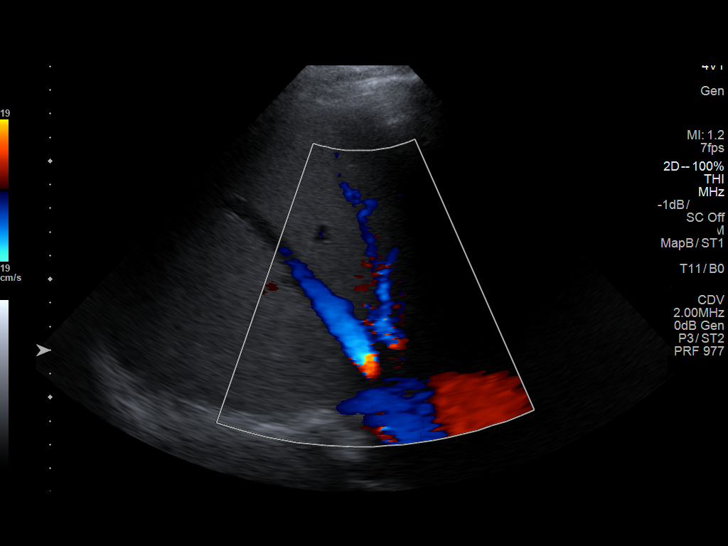
[im 46/55]
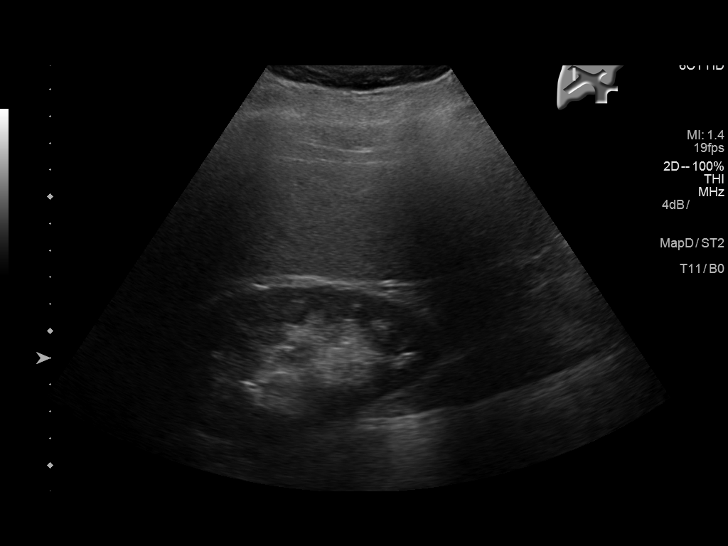
[im 50/55]
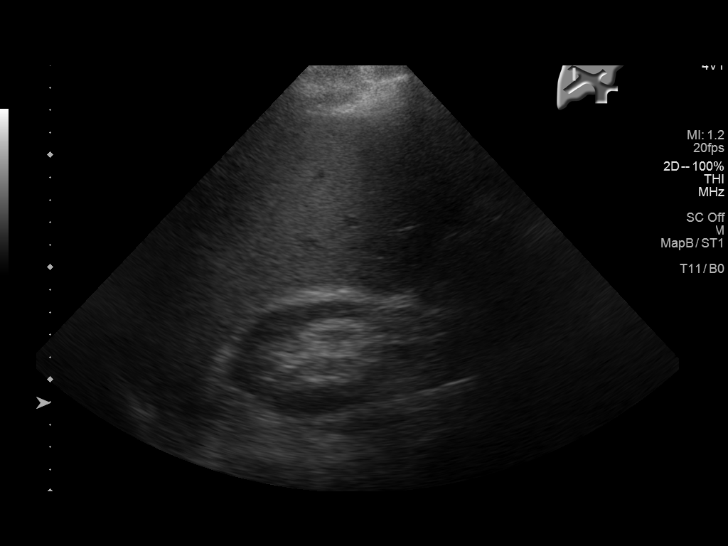
[im 55/55]
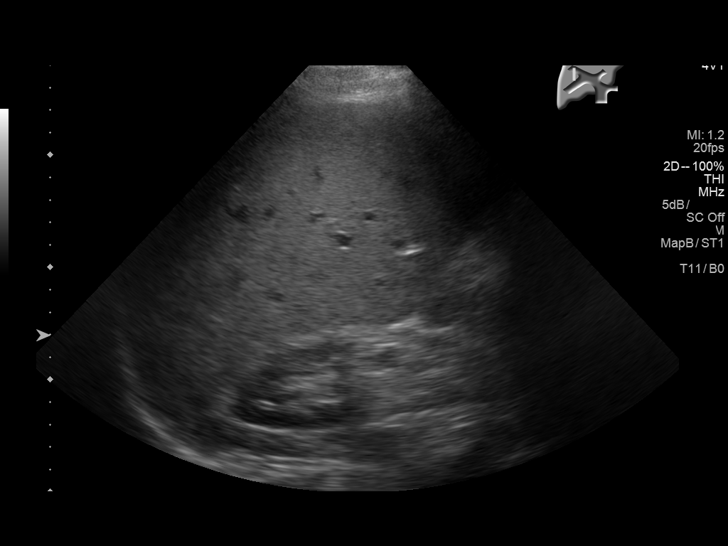

[14 of 25 positions shown; findings below may reference images not displayed]

FINDINGS: Gallbladder:

No gallstones or wall thickening visualized. No sonographic Murphy
sign noted by sonographer.

Common bile duct:

Diameter: 2.5 mm, normal.

Liver:

No focal lesion identified. There is slight diffuse increased
echogenicity of the liver parenchyma.. Portal vein is patent on
color Doppler imaging with normal direction of blood flow towards
the liver.
IMPRESSION: Slight increased echogenicity of the liver parenchyma consistent
with hepatic steatosis. Otherwise, normal exam.

## 2020-04-19 ENCOUNTER — Ambulatory Visit
Admission: EM | Admit: 2020-04-19 | Discharge: 2020-04-19 | Disposition: A | Discharge status: Home / Self Care | Payer: 59 | Attending: Family | Admitting: Family

## 2020-04-19 ENCOUNTER — Other Ambulatory Visit: Payer: Self-pay

## 2020-04-19 DIAGNOSIS — Z8249 Family history of ischemic heart disease and other diseases of the circulatory system: Secondary | ICD-10-CM | POA: Insufficient documentation

## 2020-04-19 DIAGNOSIS — E7800 Pure hypercholesterolemia, unspecified: Secondary | ICD-10-CM | POA: Diagnosis not present

## 2020-04-19 DIAGNOSIS — J069 Acute upper respiratory infection, unspecified: Secondary | ICD-10-CM | POA: Diagnosis not present

## 2020-04-19 DIAGNOSIS — R519 Headache, unspecified: Secondary | ICD-10-CM | POA: Diagnosis not present

## 2020-04-19 DIAGNOSIS — I10 Essential (primary) hypertension: Secondary | ICD-10-CM | POA: Diagnosis not present

## 2020-04-19 DIAGNOSIS — R05 Cough: Secondary | ICD-10-CM | POA: Diagnosis present

## 2020-04-19 DIAGNOSIS — Z20822 Contact with and (suspected) exposure to covid-19: Secondary | ICD-10-CM | POA: Diagnosis not present

## 2020-04-19 DIAGNOSIS — Z79899 Other long term (current) drug therapy: Secondary | ICD-10-CM | POA: Insufficient documentation

## 2020-04-19 LAB — SARS CORONAVIRUS 2 (TAT 6-24 HRS): SARS Coronavirus 2: NEGATIVE

## 2020-04-19 NOTE — ED Triage Notes (Signed)
Pt reports cough, congestion, HA, watery eyes worsening x few days.  Unsure of fever.  Productive cough, "kind of" clear.  Has had COVID vaccines.  Last dose about one month ago.  No known exposure to COVID.

## 2020-04-19 NOTE — Discharge Instructions (Addendum)
You appear to have a viral illness or "summer cold'. Recommend take OTC Claritin 10mg  daily. May continue Flonase as directed. Encouraged to take OTC Delsym or similar cough medication as needed. Avoid any cold and cough medication with a decongestant which can raise your blood pressure. May take OTC Ibuprofen 600mg  every 8 hours as needed for headache. Encouraged to restart Lisinopril today for blood pressure and call your PCP today to schedule appointment for re-evaluation and management of your blood pressure. Rest. Increase fluid intake to help loosen up mucus. If headache worsens or any chest pain or difficulty breathing occurs, go to the ER ASP. Otherwise, follow-up pending COVID 19 test results and with your PCP as planned.

## 2020-04-19 NOTE — ED Provider Notes (Signed)
[facility]    CSN: [id number] Arrival date & time: 04/19/20  0932      History   Chief Complaint Chief Complaint  Patient presents with  . Cough    HPI Jimmy Noble is a 54 y.o. male.   54 year old male presents with nasal congestion, headache, watery eyes, and slight cough for the past 2 to 3 days. Also has felt nauseous yesterday and today but no vomiting, diarrhea or distinct fever. Has taken Flonase, OTC allergy medication and Benadryl with minimal relief. No other family members ill. He has completed COVID 19 vaccine series about 1 month ago. No known exposure to COVID 19. He stopped taking all his medication because he didn't like the way the medication made him feel. He was on Lisinopril for HTN, Lipitor for hyperlipidemia and Celexa for mood disorder. Blood pressures were usually around 140/90 when he took his Lisinopril. Denies any chest pain, difficulty breathing, vision changes, or dizziness. Asking if headache may be result of elevated BP. Does not smoke or use tobacco. Last visit with his PCP was Feb 2021. Did have Tetanus booster in May 2021.   The history is provided by the patient.    Past Medical History:  Diagnosis Date  . Hypercholesteremia   . Hypertension     Patient Active Problem List   Diagnosis Date Noted  . Special screening for malignant neoplasms, colon   . Polyp of sigmoid colon   . Rectal polyp   . Chronic prostatitis 07/03/2016  . Idiopathic chronic gout of left foot without tophus 10/05/2014  . Elevated liver enzymes 06/04/2014  . Hypertension 06/04/2014  . Chest pain 03/16/2013  . Benign hypertrophy of prostate 11/28/2012  . Obesity 11/28/2012  . Alcohol abuse 12/31/2011  . Depression 12/31/2011    Past Surgical History:  Procedure Laterality Date  . COLONOSCOPY WITH PROPOFOL N/A 12/05/2018   Procedure: COLONOSCOPY WITH BIOPSIES;  Surgeon: Lucilla Lame, MD;  Location: [facility];  Service: Endoscopy;  Laterality:  N/A;  . DVIU    . HERNIA REPAIR    . POLYPECTOMY N/A 12/05/2018   Procedure: POLYPECTOMY;  Surgeon: Lucilla Lame, MD;  Location: [facility];  Service: Endoscopy;  Laterality: N/A;       Home Medications    Prior to Admission medications   Medication Sig Start Date End Date Taking? Authorizing Provider  atorvastatin (LIPITOR) 10 MG tablet  04/05/16   [provider]  citalopram (CELEXA) 20 MG tablet TAKE 1/2 TABLET(10 MG) BY MOUTH EVERY DAY 05/15/16   [provider]  lisinopril (PRINIVIL,ZESTRIL) 10 MG tablet Take by mouth. 03/30/16   [provider]  mirabegron ER (MYRBETRIQ) 25 MG TB24 tablet Take 1 tablet (25 mg total) by mouth daily. Patient not taking: Reported on 12/02/2018 12/26/17 04/19/20  Nickie Retort, MD    Family History Family History  Problem Relation Age of Onset  . Healthy Mother   . Hypertension Father   . Cancer Father   . Prostate cancer Neg Hx   . Bladder Cancer Neg Hx   . Kidney cancer Neg Hx     Social History Social History   Tobacco Use  . Smoking status: Never Smoker  . Smokeless tobacco: Never Used  Vaping Use  . Vaping Use: Never used  Substance Use Topics  . Alcohol use: Yes    Alcohol/week: 56.0 standard drinks    Types: 56 Cans of beer per week  . Drug use: No  Allergies   Patient has no known allergies.   Review of Systems Review of Systems  Constitutional: Positive for appetite change and fatigue. Negative for activity change, chills, diaphoresis and fever.  HENT: Positive for congestion, postnasal drip, rhinorrhea and sinus pressure. Negative for ear discharge, ear pain, facial swelling, mouth sores, nosebleeds, sinus pain, sore throat and trouble swallowing.   Eyes: Positive for discharge. Negative for photophobia, pain, redness, itching and visual disturbance.  Respiratory: Positive for cough. Negative for chest tightness, shortness of breath and wheezing.   Cardiovascular: Negative for  chest pain.  Gastrointestinal: Positive for nausea. Negative for abdominal pain, diarrhea and vomiting.  Musculoskeletal: Negative for arthralgias, myalgias, neck pain and neck stiffness.  Skin: Negative for color change, rash and wound.  Allergic/Immunologic: Negative for food allergies and immunocompromised state.  Neurological: Positive for headaches. Negative for dizziness, tremors, seizures, syncope, facial asymmetry, weakness, light-headedness and numbness.  Hematological: Negative for adenopathy. Does not bruise/bleed easily.     Physical Exam Triage Vital Signs ED Triage Vitals  Enc Vitals Group     BP 04/19/20 0957 (!) 170/101     Pulse Rate 04/19/20 0957 84     Resp 04/19/20 0957 20     Temp 04/19/20 0957 98.4 F (36.9 C)     Temp Source 04/19/20 0957 Oral     SpO2 04/19/20 0957 96 %     Weight --      Height --      Head Circumference --      Peak Flow --      Pain Score 04/19/20 0951 6     Pain Loc --      Pain Edu? --      Excl. in [place]? --    No data found.  Updated Vital Signs BP (!) 156/97 (BP Location: Right Arm)   Pulse 84   Temp 98.4 F (36.9 C) (Oral)   Resp 20   SpO2 96%   Visual Acuity Right Eye Distance:   Left Eye Distance:   Bilateral Distance:    Right Eye Near:   Left Eye Near:    Bilateral Near:     Physical Exam Vitals and nursing note reviewed.  Constitutional:      General: He is awake. He is not in acute distress.    Appearance: He is well-developed and well-groomed. He is ill-appearing.     Comments: He is sitting comfortably on exam table in no acute distress but appears ill.   HENT:     Head: Normocephalic and atraumatic.     Right Ear: Hearing, tympanic membrane, ear canal and external ear normal.     Left Ear: Hearing, tympanic membrane, ear canal and external ear normal.     Nose: Mucosal edema, congestion and rhinorrhea present. Rhinorrhea is clear.     Right Sinus: No maxillary sinus tenderness or frontal sinus  tenderness.     Left Sinus: No maxillary sinus tenderness or frontal sinus tenderness.     Mouth/Throat:     Lips: Pink.     Mouth: Mucous membranes are moist.     Pharynx: Oropharynx is clear. Uvula midline. No pharyngeal swelling, oropharyngeal exudate, posterior oropharyngeal erythema or uvula swelling.  Eyes:     General: Vision grossly intact. No visual field deficit.    Extraocular Movements: Extraocular movements intact.     Conjunctiva/sclera:     Right eye: Right conjunctiva is not injected. Exudate (clear) present.     Left eye: Left  conjunctiva is not injected. Exudate (clear) present.     Comments: Upper eyelids bilaterally- slightly swollen with minimal redness.   Cardiovascular:     Rate and Rhythm: Normal rate and regular rhythm.     Heart sounds: Normal heart sounds. No murmur heard.   Pulmonary:     Effort: Pulmonary effort is normal. No respiratory distress.     Breath sounds: Normal breath sounds and air entry. No stridor or decreased air movement. No decreased breath sounds, wheezing, rhonchi or rales.  Musculoskeletal:        General: Normal range of motion.     Cervical back: Normal range of motion and neck supple. No rigidity.  Lymphadenopathy:     Cervical: No cervical adenopathy.  Skin:    General: Skin is warm and dry.     Capillary Refill: Capillary refill takes less than 2 seconds.     Findings: No rash.  Neurological:     General: No focal deficit present.     Mental Status: He is alert and oriented to person, place, and time.  Psychiatric:        Mood and Affect: Mood normal.        Behavior: Behavior normal. Behavior is cooperative.        Thought Content: Thought content normal.        Judgment: Judgment normal.      UC Treatments / Results  Labs (all labs ordered are listed, but only abnormal results are displayed) Labs Reviewed  SARS CORONAVIRUS 2 (TAT 6-24 HRS)    EKG   Radiology No results found.  Procedures Procedures  (including critical care time)  Medications Ordered in UC Medications - No data to display  Initial Impression / Assessment and Plan / UC Course  I have reviewed the triage vital signs and the nursing notes.  Pertinent labs & imaging results that were available during my care of the patient were reviewed by me and considered in my medical decision making (see chart for details).    Reviewed with patient that he probably has a viral upper respiratory infection. Low probability of COVID 19 but will send out testing. Recommend continue Flonase as directed. May take OTC Claritin 10mg  daily. Encouraged to take OTC Delsym or similar cough medication as needed. Rest. Increase fluid intake to help loosen up mucus. Avoid any decongestants in OTC medications which can raise his blood pressure. Discussed that elevated BP can cause headache. May take either Tylenol 1000mg  or Ibuprofen 600mg  every 8 hours as needed for pain. Encouraged to restart Lisinopril today for blood pressure management and call his PCP today to schedule appointment for re-evaluation of blood pressure and treatment. If headache worsens or any chest pain, difficulty breathing or vision changes occur, go to the ER ASAP. Otherwise, follow-up pending COVID 19 test results and with his PCP as planned.  Final Clinical Impressions(s) / UC Diagnoses   Final diagnoses:  Viral URI with cough  Elevated blood pressure reading with diagnosis of hypertension  Frontal headache     Discharge Instructions     You appear to have a viral illness or "summer cold'. Recommend take OTC Claritin 10mg  daily. May continue Flonase as directed. Encouraged to take OTC Delsym or similar cough medication as needed. Avoid any cold and cough medication with a decongestant which can raise your blood pressure. May take OTC Ibuprofen 600mg  every 8 hours as needed for headache. Encouraged to restart Lisinopril today for blood pressure and call your PCP  today to schedule  appointment for re-evaluation and management of your blood pressure. Rest. Increase fluid intake to help loosen up mucus. If headache worsens or any chest pain or difficulty breathing occurs, go to the ER ASP. Otherwise, follow-up pending COVID 19 test results and with your PCP as planned.     ED Prescriptions    None     PDMP not reviewed this encounter.   Katy Apo, NP 04/19/20 1923

## 2020-06-03 ENCOUNTER — Other Ambulatory Visit: Payer: 59

## 2021-11-16 ENCOUNTER — Other Ambulatory Visit: Payer: Self-pay | Admitting: Family Medicine

## 2021-11-16 DIAGNOSIS — K760 Fatty (change of) liver, not elsewhere classified: Secondary | ICD-10-CM

## 2021-11-16 DIAGNOSIS — F1010 Alcohol abuse, uncomplicated: Secondary | ICD-10-CM

## 2021-11-16 DIAGNOSIS — R195 Other fecal abnormalities: Secondary | ICD-10-CM

## 2021-12-05 ENCOUNTER — Other Ambulatory Visit
Admission: RE | Admit: 2021-12-05 | Discharge: 2021-12-05 | Disposition: A | Discharge status: Home / Self Care | Payer: 59 | Attending: Urology | Admitting: Urology

## 2021-12-05 ENCOUNTER — Other Ambulatory Visit: Payer: Self-pay

## 2021-12-05 ENCOUNTER — Encounter: Payer: Self-pay | Admitting: Urology

## 2021-12-05 ENCOUNTER — Ambulatory Visit (INDEPENDENT_AMBULATORY_CARE_PROVIDER_SITE_OTHER): Payer: 59 | Admitting: Urology

## 2021-12-05 VITALS — BP 134/84 | HR 79 | Ht 72.0 in | Wt 260.0 lb

## 2021-12-05 DIAGNOSIS — N410 Acute prostatitis: Secondary | ICD-10-CM | POA: Diagnosis not present

## 2021-12-05 DIAGNOSIS — R972 Elevated prostate specific antigen [PSA]: Secondary | ICD-10-CM | POA: Insufficient documentation

## 2021-12-05 LAB — URINALYSIS, COMPLETE (UACMP) WITH MICROSCOPIC
Bacteria, UA: NONE SEEN
Bilirubin Urine: NEGATIVE
Glucose, UA: NEGATIVE mg/dL
Hgb urine dipstick: NEGATIVE
Ketones, ur: NEGATIVE mg/dL
Nitrite: NEGATIVE
Protein, ur: 100 mg/dL — AB
Specific Gravity, Urine: 1.02 (ref 1.005–1.030)
pH: 6 (ref 5.0–8.0)

## 2021-12-05 MED ORDER — SULFAMETHOXAZOLE-TRIMETHOPRIM 800-160 MG PO TABS: 1.0000 | ORAL_TABLET | Freq: Two times a day (BID) | ORAL | 0 refills | Status: AC

## 2021-12-05 NOTE — Progress Notes (Signed)
° °  12/05/21 11:36 AM   Jimmy Noble June 08, 1966 [id number]  CC: Elevated PSA, foul-smelling urine, history of prostatitis  HPI: 56 year old male with history of meatotomy by Dr. Elnoria Howard in 2014 referred for the above issues.  Recent PSA on 11/15/2021 was mildly elevated at 4.46 from 1.3 in September 2022, and 0.87 in August 2021.  He also reports foul-smelling urine over the last month or so, as well as some urgency/frequency.  He was also seen by Dr. Pilar Jarvis in February 2019 for overactive bladder type picture and he was trialed on Myrbetriq but discontinued that medication.  Urinalysis today is pending.   PMH: Past Medical History:  Diagnosis Date   Hypercholesteremia    Hypertension     Surgical History: Past Surgical History:  Procedure Laterality Date   COLONOSCOPY WITH PROPOFOL N/A 12/05/2018   Procedure: COLONOSCOPY WITH BIOPSIES;  Surgeon: Lucilla Lame, MD;  Location: [facility];  Service: Endoscopy;  Laterality: N/A;   DVIU     HERNIA REPAIR     POLYPECTOMY N/A 12/05/2018   Procedure: POLYPECTOMY;  Surgeon: Lucilla Lame, MD;  Location: [facility];  Service: Endoscopy;  Laterality: N/A;   Family History: Family History  Problem Relation Age of Onset   Healthy Mother    Hypertension Father    Cancer Father    Prostate cancer Neg Hx    Bladder Cancer Neg Hx    Kidney cancer Neg Hx     Social History:  reports that he has never smoked. He has never used smokeless tobacco. He reports current alcohol use of about 56.0 standard drinks per week. He reports that he does not use drugs.  Physical Exam: BP 134/84    Pulse 79    Ht 6' (1.829 m)    Wt 260 lb (117.9 kg)    BMI 35.26 kg/m    Constitutional:  Alert and oriented, No acute distress. Cardiovascular: No clubbing, cyanosis, or edema. Respiratory: Normal respiratory effort, no increased work of breathing. GI: Abdomen is soft, nontender, nondistended, no abdominal masses GU: widely patent meatus DRE:  30 g, smooth, no nodules or masses, extremely tender to palpation  Assessment & Plan:   56 year old male with mildly elevated PSA, foul-smelling urine, and extremely tender prostate.  We reviewed the implications of an elevated PSA and the uncertainty surrounding it. In general, a man's PSA increases with age and is produced by both normal and cancerous prostate tissue. The differential diagnosis for elevated PSA includes BPH, prostate cancer, infection, recent intercourse/ejaculation, recent urethroscopic manipulation (foley placement/cystoscopy) or trauma, and prostatitis.   Management of an elevated PSA can include observation or prostate biopsy and we discussed this in detail. Our goal is to detect clinically significant prostate cancers, and manage with either active surveillance, surgery, or radiation for localized disease. Risks of prostate biopsy include bleeding, infection (including life threatening sepsis), pain, and lower urinary symptoms. Hematuria, hematospermia, and blood in the stool are all common after biopsy and can persist up to 4 weeks.   With his recent normal PSA in September 2022, and foul-smelling urine and extremely tender prostate, suspect clinical picture more consistent with prostatitis causing a false elevation in the PSA.  I recommended a 2-week course of Bactrim DS twice daily, and repeat PSA in 6 weeks.  Follow-up urinalysis and culture  Nickolas Madrid, MD 12/05/2021  [facility] [address], [place], [place] [address] [phone]

## 2021-12-05 NOTE — Patient Instructions (Signed)
Prostate Cancer Screening Prostate cancer screening is testing that is done to check for the presence of prostate cancer in men. The prostate gland is a walnut-sized gland that is located below the bladder and in front of the rectum in males. The function of the prostate is to add fluid to semen during ejaculation. Prostate cancer is one of the most common types of cancer in men. Who should have prostate cancer screening? Screening recommendations vary based on age and other risk factors, as well as between the professional organizations who make the recommendations. In general, screening is recommended if: You are age 65 to 6 and have an average risk for prostate cancer. You should talk with your health care provider about your need for screening and how often screening should be done. Because most prostate cancers are slow growing and will not cause death, screening in this age group is generally reserved for men who have a 47- to 15-year life expectancy. You are younger than age 55, and you have these risk factors: Having a father, brother, or uncle who has been diagnosed with prostate cancer. The risk is higher if your family member's cancer occurred at an early age or if you have multiple family members with prostate cancer at an early age. Being a male who is [place] or is of [place] or sub-Saharan African descent. In general, screening is not recommended if: You are younger than age 66. You are between the ages of 13 and 53 and you have no risk factors. You are 39 years of age or older. At this age, the risks that screening can cause are greater than the benefits that it may provide. If you are at high risk for prostate cancer, your health care provider may recommend that you have screenings more often or that you start screening at a younger age. How is screening for prostate cancer done? The recommended prostate cancer screening test is a blood test called the prostate-specific antigen (PSA)  test. PSA is a protein that is made in the prostate. As you age, your prostate naturally produces more PSA. Abnormally high PSA levels may be caused by: Prostate cancer. An enlarged prostate that is not caused by cancer (benign prostatic hyperplasia, or BPH). This condition is very common in older men. A prostate gland infection (prostatitis) or urinary tract infection. Certain medicines such as male hormones (like testosterone) or other medicines that raise testosterone levels. A rectal exam may be done as part of prostate cancer screening to help provide information about the size of your prostate gland. When a rectal exam is performed, it should be done after the PSA level is drawn to avoid any effect on the results. Depending on the PSA results, you may need more tests, such as: A physical exam to check the size of your prostate gland, if not done as part of screening. Blood and imaging tests. A procedure to remove tissue samples from your prostate gland for testing (biopsy). This is the only way to know for certain if you have prostate cancer. What are the benefits of prostate cancer screening? Screening can help to identify cancer at an early stage, before symptoms start and when the cancer can be treated more easily. There is a small chance that screening may lower your risk of dying from prostate cancer. The chance is small because prostate cancer is a slow-growing cancer, and most men with prostate cancer die from a different cause. What are the risks of prostate cancer screening? The main  risk of prostate cancer screening is diagnosing and treating prostate cancer that would never have caused any symptoms or problems. This is called overdiagnosisand overtreatment. PSA screening cannot tell you if your PSA is high due to cancer or a different cause. A prostate biopsy is the only procedure to diagnose prostate cancer. Even the results of a biopsy may not tell you if your cancer needs to be  treated. Slow-growing prostate cancer may not need any treatment other than monitoring, so diagnosing and treating it may cause unnecessary stress or other side effects. Questions to ask your health care provider When should I start prostate cancer screening? What is my risk for prostate cancer? How often do I need screening? What type of screening tests do I need? How do I get my test results? What do my results mean? Do I need treatment? Where to find more information The American Cancer Society: www.cancer.org American Urological Association: www.auanet.org Contact a health care provider if: You have difficulty urinating. You have pain when you urinate or ejaculate. You have blood in your urine or semen. You have pain in your back or in the area of your prostate. Summary Prostate cancer is a common type of cancer in men. The prostate gland is located below the bladder and in front of the rectum. This gland adds fluid to semen during ejaculation. Prostate cancer screening may identify cancer at an early stage, when the cancer can be treated more easily and is less likely to have spread to other areas of the body. The prostate-specific antigen (PSA) test is the recommended screening test for prostate cancer, but it has associated risks. Discuss the risks and benefits of prostate cancer screening with your health care provider. If you are age 49 or older, the risks that screening can cause are greater than the benefits that it may provide. This information is not intended to replace advice given to you by your health care provider. Make sure you discuss any questions you have with your health care provider. Document Revised: 04/17/2021 Document Reviewed: 04/17/2021 Elsevier Patient Education  2022 [place].   Prostatitis Prostatitis is swelling or inflammation of the prostate gland, also called the prostate. This gland is about 1.5 inches wide and 1 inch high, and it is involved in  making semen. The prostate is located below a man's bladder, in front of the rectum. There are four types of prostatitis: Chronic prostatitis (CP), also called chronic pelvic pain syndrome (CPPS). This is the most common type of prostatitis. It is associated with increased muscle tone in the area between the hip bones (pelvic area), around the prostate. This type is also known as a pelvic floor disorder. Chronic bacterial prostatitis. This type usually results from an acute bacterial infection in the prostate gland that keeps coming back or has not been treated properly. The symptoms are less severe than those caused by acute bacterial prostatitis, which lasts a shorter time. Asymptomatic inflammatory prostatitis. This type does not have symptoms and does not need treatment. This is diagnosed when tests are done for other disorders of the urinary tract or reproductive tract. Acute bacterial prostatitis. This type starts quickly and results from an acute bacterial infection in the prostate gland. It is usually associated with a bladder infection, high fever, and chills. This is the least common type of prostatitis. What are the causes? Bacterial prostatitis is caused by an infection from bacteria. Chronic nonbacterial prostatitis may be caused by: Factors related to the nervous system. This system  includes thebrain, spinal cord, and nerves. An autoimmune response. This happens when the body's disease-fighting system attacks healthy tissue in the body by mistake. Psychological factors. These have to do with how the mind works. The causes of the other types of prostatitis are usually not known. What are the signs or symptoms? Symptoms of this condition depend on the type of prostatitis you have. Acute bacterial prostatitis Symptoms may include: Pain or burning during urination. Frequent and sudden urges to urinate. Trouble starting to urinate. Fever. Chills. Pain in your muscles or joints, lower  back, or lower abdomen. Other types of prostatitis Symptoms may include: Sudden urges to urinate, or urinating often. Trouble starting to urinate. Weak urine stream. Dribbling after urination. Discharge coming from the penis. Pain in the testicles, the penis, or the tip of the penis. Pain in the area in front of the rectum and below the scrotum (perineum). Pain when ejaculating. How is this diagnosed? This condition may be diagnosed based on: A physical and medical exam. A digital rectal exam. For this, the health care provider may use a finger to feel the prostate. A urine test to check for bacteria. A semen sample or blood tests. Ultrasound. Urodynamic tests to check how your body handles urine. Cystoscopy to look inside your bladder or inside the part of your body that drains urine from the bladder (urethra). How is this treated? Treatment for this condition depends on the type of prostatitis. Treatment may involve: Medicines to relieve pain or inflammation, or to help relax your muscles. Physical therapy. Heat therapy. Biofeedback. These techniques help you control certain body functions. Relaxation exercises. Antibiotic medicine, if your condition is caused by bacteria. Sitz baths. These warm water baths help to relax your pelvic floor muscles, which helps to relieve pressure on the prostate. Follow these instructions at home: Medicines Take over-the-counter and prescription medicines only as told by your health care provider. If you were prescribed an antibiotic medicine, take it as told by your health care provider. Do not stop using the antibiotic even if you start to feel better. Managing pain and swelling  Take sitz baths as directed by your health care provider. For a sitz bath, sit in warm water that is deep enough to cover your hips and buttocks. If directed, apply heat to the affected area as often as told by your health care provider. Use the heat source that your  health care provider recommends, such as a moist heat pack or a heating pad. Place a towel between your skin and the heat source. Leave the heat on for 20-30 minutes. Remove the heat if your skin turns bright red. This is especially important if you are unable to feel pain, heat, or cold. You may have a greater risk of getting burned. General instructions Do exercises as told by your health care provider, if you were prescribed physical therapy, biofeedback, or relaxation exercises. Keep all follow-up visits as told by your health care provider. This is important. Where to find more information [employer] of Diabetes and Digestive and Kidney Diseases: http://www.bass.com/ Contact a health care provider if: Your symptoms get worse. You have a fever. Get help right away if: You have chills. You feel light-headed or feel like you may faint. You cannot urinate. You have blood or blood clots in your urine. Summary Prostatitis is swelling or inflammation of the prostate gland. Treatment for this condition depends on the type of prostatitis. Take over-the-counter and prescription medicines only as told by your  health care provider. Get help right away of you have chills, feel light-headed, feel like you may faint, cannot urinate, or have blood or blood clots in your urine. This information is not intended to replace advice given to you by your health care provider. Make sure you discuss any questions you have with your health care provider. Document Revised: 11/27/2019 Document Reviewed: 11/27/2019 Elsevier Patient Education  2022 [employer].

## 2021-12-07 LAB — URINE CULTURE: Culture: <10000 — AB

## 2022-01-02 ENCOUNTER — Encounter: Payer: Self-pay | Admitting: Urology

## 2022-01-02 ENCOUNTER — Other Ambulatory Visit: Payer: Self-pay

## 2022-01-02 ENCOUNTER — Other Ambulatory Visit
Admission: RE | Admit: 2022-01-02 | Discharge: 2022-01-02 | Disposition: A | Discharge status: Home / Self Care | Payer: 59 | Attending: Urology | Admitting: Urology

## 2022-01-02 ENCOUNTER — Ambulatory Visit (INDEPENDENT_AMBULATORY_CARE_PROVIDER_SITE_OTHER): Payer: 59 | Admitting: Urology

## 2022-01-02 VITALS — BP 143/83 | HR 78 | Ht 72.0 in | Wt 259.0 lb

## 2022-01-02 DIAGNOSIS — R972 Elevated prostate specific antigen [PSA]: Secondary | ICD-10-CM

## 2022-01-02 DIAGNOSIS — N410 Acute prostatitis: Secondary | ICD-10-CM

## 2022-01-02 MED ORDER — CIPROFLOXACIN HCL 500 MG PO TABS: 500.0000 mg | ORAL_TABLET | Freq: Two times a day (BID) | ORAL | 0 refills | Status: DC

## 2022-01-02 MED ORDER — CIPROFLOXACIN HCL 500 MG PO TABS: 500.0000 mg | ORAL_TABLET | Freq: Two times a day (BID) | ORAL | 0 refills | Status: AC

## 2022-01-02 NOTE — Patient Instructions (Signed)

## 2022-01-02 NOTE — Progress Notes (Signed)
° °  01/02/2022 4:29 PM   Katheren Puller February 25, 1966 [id number]  Reason for visit: Follow up prostatitis, elevated PSA  HPI: 56 year old male who I saw on 12/05/2021 for foul-smelling urine and urgency/frequency.  Urine culture was negative, but DRE was extremely tender at that time consistent with prostatitis.  PSA was also elevated at that time at 4.46 from 1.3 in September 2022.  He was treated with 2 weeks of Bactrim DS twice daily, and had significant improvement in his urinary symptoms and foul-smelling urine for the first few weeks after that, but his symptoms have returned over the last 1 to 2 weeks.  We discussed that in patients with prostatitis sometimes a longer course of antibiotics is needed.  He would like to avoid taking the Bactrim again as this caused headache and some abdominal discomfort.  PSA was drawn today, and will call with those results-this will likely be elevated in the setting of persistent prostatitis.  -Cipro 500 mg twice daily x4 weeks for prostatitis -Call with PSA results -RTC 6 weeks symptom check-consider cystoscopy or even CT pelvis to evaluate for prostate abscess if persistent symptoms  Billey Co, MD  [facility] [address] [place], [place] [address] [phone]

## 2022-01-03 ENCOUNTER — Telehealth: Payer: Self-pay

## 2022-01-03 LAB — PSA, TOTAL AND FREE
PSA, Free Pct: 11.8 %
PSA, Free: 0.13 ng/mL
Prostate Specific Ag, Serum: 1.1 ng/mL (ref 0.0–4.0)

## 2022-01-03 NOTE — Telephone Encounter (Signed)
-----   Message from Billey Co, MD sent at 01/03/2022  9:07 AM EST ----- ?Good news, PSA back down to normal at 1.1.  Suspect prostatitis as the cause of his previous PSA elevation, continue antibiotics for 1 month as discussed in clinic this week ? ?Nickolas Madrid, MD ?01/03/2022 ? ? ?

## 2022-02-13 ENCOUNTER — Telehealth: Payer: 59 | Admitting: Urology

## 2023-01-10 ENCOUNTER — Ambulatory Visit: Admission: EM | Admit: 2023-01-10 | Discharge: 2023-01-10 | Payer: 59

## 2023-01-10 ENCOUNTER — Encounter: Payer: Self-pay | Admitting: Emergency Medicine

## 2023-01-10 DIAGNOSIS — R079 Chest pain, unspecified: Secondary | ICD-10-CM

## 2023-01-10 NOTE — ED Triage Notes (Addendum)
Pt c/o central chest pain x 4 days maybe longer. He describes the pain as a cramping feeling. He denies trying anything to relieve the pain. He took his BP at [facility] today and it was 196/94. Pt has not taken his BP medication medication in at least 1 week he ran out of medication. His PCP is [facility]

## 2023-01-10 NOTE — ED Notes (Signed)
Patient is being discharged from the Urgent Care and sent to the Emergency Department via personal vehicle . Per Jeananne Rama NP, patient is in need of higher level of care due to chest pain. Patient is aware and verbalizes understanding of plan of care.  Vitals:   01/10/23 1152 01/10/23 1154  BP:  (!) 142/92  Pulse: 85   Resp: 16   Temp: 98.5 F (36.9 C)   SpO2: 97%

## 2023-01-10 NOTE — ED Provider Notes (Signed)
Patient presents for evaluation of constant centralized chest pain beginning 4 days ago, symptoms have persisted but have not worsened in intensity.  Pain is described as a cramping sensation but endorses that this does not feel like heartburn or indigestion that he is experienced in the past.  Pain does not radiate.  Was at [facility] today and check blood pressure, reading 196/94 which is elevated from his baseline.  Endorses that he ran out of his lisinopril 1 week ago.  History of hypertension, paternal history of MI.  Denies dizziness, lightheadedness, shortness of breath, abdominal pain, visual disturbance.  Vital signs are stable, blood pressure in triage 142/92, chest pain is active, EKG showing normal sinus rhythm, patient is being sent to the nearest emergency department for full cardiac workup, to escort self, has chosen to be evaluated at [facility], [place], NP 01/10/23 1229

## 2023-01-10 NOTE — Discharge Instructions (Addendum)
Please go to the nearest emergency department for further evaluation of your chest pain and to receive a full cardiac workup  EKG shows heart is currently beating in a normal pace and rhythm, this is a 6-second interval and while it shows that you are not having a heart attack currently does not tell me that you do not have any further concerning conditions such as a partial blockage

## 2023-01-11 ENCOUNTER — Encounter: Payer: Self-pay | Admitting: *Deleted

## 2023-01-14 ENCOUNTER — Ambulatory Visit: Payer: 59 | Admitting: Anesthesiology

## 2023-01-14 ENCOUNTER — Encounter: Payer: Self-pay | Admitting: *Deleted

## 2023-01-14 ENCOUNTER — Ambulatory Visit
Admission: RE | Admit: 2023-01-14 | Discharge: 2023-01-14 | Disposition: A | Discharge status: Home / Self Care | Payer: 59 | Source: Ambulatory Visit | Attending: Gastroenterology | Admitting: Gastroenterology

## 2023-01-14 ENCOUNTER — Encounter
Admission: RE | Disposition: A | Discharge status: Home / Self Care | Payer: Self-pay | Source: Ambulatory Visit | Attending: Gastroenterology

## 2023-01-14 DIAGNOSIS — Z6836 Body mass index (BMI) 36.0-36.9, adult: Secondary | ICD-10-CM | POA: Diagnosis not present

## 2023-01-14 DIAGNOSIS — E785 Hyperlipidemia, unspecified: Secondary | ICD-10-CM | POA: Diagnosis not present

## 2023-01-14 DIAGNOSIS — K640 First degree hemorrhoids: Secondary | ICD-10-CM | POA: Diagnosis not present

## 2023-01-14 DIAGNOSIS — I10 Essential (primary) hypertension: Secondary | ICD-10-CM | POA: Diagnosis not present

## 2023-01-14 DIAGNOSIS — K529 Noninfective gastroenteritis and colitis, unspecified: Secondary | ICD-10-CM | POA: Insufficient documentation

## 2023-01-14 DIAGNOSIS — K635 Polyp of colon: Secondary | ICD-10-CM | POA: Insufficient documentation

## 2023-01-14 DIAGNOSIS — F32A Depression, unspecified: Secondary | ICD-10-CM | POA: Insufficient documentation

## 2023-01-14 HISTORY — PX: COLONOSCOPY WITH PROPOFOL: SHX5780

## 2023-01-14 SURGERY — COLONOSCOPY WITH PROPOFOL
Anesthesia: General

## 2023-01-14 MED ORDER — PROPOFOL 10 MG/ML IV BOLUS
INTRAVENOUS | Status: DC | PRN
  Administered 2023-01-14: 20 mg via INTRAVENOUS
  Administered 2023-01-14: 80 mg via INTRAVENOUS

## 2023-01-14 MED ORDER — LIDOCAINE HCL (CARDIAC) PF 100 MG/5ML IV SOSY
PREFILLED_SYRINGE | INTRAVENOUS | Status: DC | PRN
  Administered 2023-01-14: 60 mg via INTRAVENOUS

## 2023-01-14 MED ORDER — PROPOFOL 500 MG/50ML IV EMUL
INTRAVENOUS | Status: DC | PRN
  Administered 2023-01-14: 175 ug/kg/min via INTRAVENOUS

## 2023-01-14 MED ORDER — SODIUM CHLORIDE 0.9 % IV SOLN
INTRAVENOUS | Status: DC
  Administered 2023-01-14: 1000 mL via INTRAVENOUS

## 2023-01-14 MED ORDER — DEXMEDETOMIDINE HCL IN NACL 80 MCG/20ML IV SOLN
INTRAVENOUS | Status: DC | PRN
  Administered 2023-01-14: 12 ug via INTRAVENOUS

## 2023-01-14 MED ORDER — PHENYLEPHRINE 80 MCG/ML (10ML) SYRINGE FOR IV PUSH (FOR BLOOD PRESSURE SUPPORT)
PREFILLED_SYRINGE | INTRAVENOUS | Status: AC
  Filled 2023-01-14: qty 10

## 2023-01-14 NOTE — Anesthesia Postprocedure Evaluation (Signed)
Anesthesia Post Note  Patient: Jimmy Noble  Procedure(s) Performed: COLONOSCOPY WITH PROPOFOL  Patient location during evaluation: PACU Anesthesia Type: General Level of consciousness: awake and awake and alert Pain management: pain level controlled Vital Signs Assessment: post-procedure vital signs reviewed and stable Respiratory status: spontaneous breathing and nonlabored ventilation Cardiovascular status: stable Anesthetic complications: no   No notable events documented.   Last Vitals:  Vitals:   01/14/23 0845 01/14/23 0855  BP:    Pulse: 72 64  Resp:    Temp:    SpO2: 97% 99%    Last Pain:  Vitals:   01/14/23 0855  TempSrc:   PainSc: 0-No pain                 VAN STAVEREN,Aydrien Froman

## 2023-01-14 NOTE — Op Note (Signed)
[facility] Gastroenterology Patient Name: Jimmy Noble Procedure Date: 01/14/2023 7:00 AM MRN: [id number] Account #: [id number] Date of Birth: 24-Jun-1966 Admit Type: Outpatient Age: 57 Room: [facility] ENDO ROOM 3 Gender: Male Note Status: Finalized Instrument Name: Colonoscope [id number] Procedure:             Colonoscopy Indications:           Chronic diarrhea Providers:             Andrey Farmer MD, MD Referring MD:          Sofie Hartigan (Referring MD) Medicines:             Monitored Anesthesia Care Complications:         No immediate complications. Estimated blood loss:                         Minimal. Procedure:             Pre-Anesthesia Assessment:                        - Prior to the procedure, a History and Physical was                         performed, and patient medications and allergies were                         reviewed. The patient is competent. The risks and                         benefits of the procedure and the sedation options and                         risks were discussed with the patient. All questions                         were answered and informed consent was obtained.                         Patient identification and proposed procedure were                         verified by the physician, the nurse, the                         anesthesiologist, the anesthetist and the technician                         in the endoscopy suite. Mental Status Examination:                         alert and oriented. Airway Examination: normal                         oropharyngeal airway and neck mobility. Respiratory                         Examination: clear to auscultation. CV Examination:  normal. Prophylactic Antibiotics: The patient does not                         require prophylactic antibiotics. Prior                         Anticoagulants: The patient has taken no anticoagulant                         or  antiplatelet agents. ASA Grade Assessment: II - A                         patient with mild systemic disease. After reviewing                         the risks and benefits, the patient was deemed in                         satisfactory condition to undergo the procedure. The                         anesthesia plan was to use monitored anesthesia care                         (MAC). Immediately prior to administration of                         medications, the patient was re-assessed for adequacy                         to receive sedatives. The heart rate, respiratory                         rate, oxygen saturations, blood pressure, adequacy of                         pulmonary ventilation, and response to care were                         monitored throughout the procedure. The physical                         status of the patient was re-assessed after the                         procedure.                        After obtaining informed consent, the colonoscope was                         passed under direct vision. Throughout the procedure,                         the patient's blood pressure, pulse, and oxygen                         saturations were monitored continuously. The  Colonoscope was introduced through the anus and                         advanced to the the terminal ileum. The colonoscopy                         was performed without difficulty. The patient                         tolerated the procedure well. The quality of the bowel                         preparation was fair. The terminal ileum, ileocecal                         valve, appendiceal orifice, and rectum were                         photographed. Findings:      The perianal and digital rectal examinations were normal.      The terminal ileum appeared normal. Biopsies were taken with a cold       forceps for histology. Estimated blood loss was minimal.      Normal mucosa was found  in the entire colon. Biopsies for histology were       taken with a cold forceps from the entire colon for evaluation of       microscopic colitis. Estimated blood loss was minimal.      A 2 mm polyp was found in the sigmoid colon. The polyp was sessile. The       polyp was removed with a jumbo cold forceps. Resection and retrieval       were complete. Estimated blood loss was minimal.      Internal hemorrhoids were found during retroflexion. The hemorrhoids       were Grade I (internal hemorrhoids that do not prolapse).      The exam was otherwise without abnormality on direct and retroflexion       views. Impression:            - Preparation of the colon was fair.                        - The examined portion of the ileum was normal.                         Biopsied.                        - Normal mucosa in the entire examined colon. Biopsied.                        - One 2 mm polyp in the sigmoid colon, removed with a                         jumbo cold forceps. Resected and retrieved.                        - Internal hemorrhoids.                        -  The examination was otherwise normal on direct and                         retroflexion views. Recommendation:        - Discharge patient to home.                        - Resume previous diet.                        - Continue present medications.                        - Await pathology results.                        - Repeat colonoscopy for surveillance based on                         pathology results. Prep was fair. Given unremarkable                         colonoscopy 4-5 years ago, would likely repeat                         colonoscopy in 5 years.                        - Return to referring physician as previously                         scheduled. Procedure Code(s):     --- Professional ---                        [phone], Colonoscopy, flexible; with biopsy, single or                         multiple Diagnosis Code(s):      --- Professional ---                        K64.0, First degree hemorrhoids                        D12.5, Benign neoplasm of sigmoid colon                        K52.9, Noninfective gastroenteritis and colitis,                         unspecified CPT copyright 2022 [employer]. All rights reserved. The codes documented in this report are preliminary and upon coder review may  be revised to meet current compliance requirements. Andrey Farmer MD, MD 01/14/2023 8:36:02 AM Number of Addenda: 0 Note Initiated On: 01/14/2023 7:00 AM Scope Withdrawal Time: 0 hours 11 minutes 17 seconds  Total Procedure Duration: 0 hours 13 minutes 22 seconds  Estimated Blood Loss:  Estimated blood loss was minimal.      [facility]

## 2023-01-14 NOTE — Transfer of Care (Signed)
Immediate Anesthesia Transfer of Care Note  Patient: Jimmy Noble  Procedure(s) Performed: COLONOSCOPY WITH PROPOFOL  Patient Location: PACU  Anesthesia Type:General  Level of Consciousness: awake, alert , and oriented  Airway & Oxygen Therapy: Patient Spontanous Breathing  Post-op Assessment: Report given to RN and Post -op Vital signs reviewed and stable  Post vital signs: Reviewed and stable  Last Vitals:  Vitals Value Taken Time  BP    Temp    Pulse    Resp    SpO2      Last Pain:  Vitals:   01/14/23 0732  TempSrc: Temporal  PainSc: 0-No pain         Complications: No notable events documented.

## 2023-01-14 NOTE — H&P (Signed)
Outpatient short stay form Pre-procedure 01/14/2023  Jimmy Rubenstein, MD  Primary Physician: Sofie Hartigan, MD  Reason for visit:  Chronic diarrhea  History of present illness:    57 y/o gentleman with history of obesity, hypertension, and HLD here for colonoscopy due to chronic diarrhea. He does drink 6-8 beers daily. No blood thinners. No family history of GI malignancies. History of abdominal hernia repairs when he was younger. Notes that changing his statin improved his loose stools somewhat.    Current Facility-Administered Medications:    0.9 %  sodium chloride infusion, , Intravenous, Continuous, Cypher Paule, Hilton Cork, MD, Last Rate: 20 mL/hr at 01/14/23 0803, Continued from Pre-op at 01/14/23 0803  Medications Prior to Admission  Medication Sig Dispense Refill Last Dose   citalopram (CELEXA) 20 MG tablet TAKE 1/2 TABLET(10 MG) BY MOUTH EVERY DAY   Past Week   indomethacin (INDOCIN) 50 MG capsule TAKE 1 CAPSULE(50 MG) BY MOUTH TWICE DAILY AS NEEDED FOR GOUT   Past Week   lisinopril (PRINIVIL,ZESTRIL) 10 MG tablet Take by mouth.   Past Month   rosuvastatin (CRESTOR) 20 MG tablet Take 20 mg by mouth at bedtime.   Past Week   sildenafil (REVATIO) 20 MG tablet Take by mouth.   Past Month     No Known Allergies   Past Medical History:  Diagnosis Date   Hypercholesteremia    Hypertension     Review of systems:  Otherwise negative.    Physical Exam  Gen: Alert, oriented. Appears stated age.  HEENT: PERRLA. Lungs: No respiratory distress CV: RRR Abd: soft, benign, no masses Ext: No edema    Planned procedures: Proceed with colonoscopy. The patient understands the nature of the planned procedure, indications, risks, alternatives and potential complications including but not limited to bleeding, infection, perforation, damage to internal organs and possible oversedation/side effects from anesthesia. The patient agrees and gives consent to proceed.  Please refer to  procedure notes for findings, recommendations and patient disposition/instructions.     Jimmy Rubenstein, MD [facility] Gastroenterology

## 2023-01-14 NOTE — Interval H&P Note (Signed)
History and Physical Interval Note:  01/14/2023 8:12 AM  Jimmy Noble  has presented today for surgery, with the diagnosis of Chronic Diarrhea.  The various methods of treatment have been discussed with the patient and family. After consideration of risks, benefits and other options for treatment, the patient has consented to  Procedure(s): COLONOSCOPY WITH PROPOFOL (N/A) as a surgical intervention.  The patient's history has been reviewed, patient examined, no change in status, stable for surgery.  I have reviewed the patient's chart and labs.  Questions were answered to the patient's satisfaction.     Lesly Rubenstein  Ok to proceed with colonoscopy

## 2023-01-14 NOTE — Anesthesia Preprocedure Evaluation (Signed)
Anesthesia Evaluation  Patient identified by MRN, date of birth, ID band Patient awake    Reviewed: Allergy & Precautions, NPO status , Patient's Chart, lab work & pertinent test results  Airway Mallampati: III  TM Distance: >3 FB Neck ROM: Full    Dental  (+) Teeth Intact   Pulmonary neg pulmonary ROS   Pulmonary exam normal breath sounds clear to auscultation       Cardiovascular Exercise Tolerance: Good hypertension, Pt. on medications negative cardio ROS Normal cardiovascular exam Rhythm:Regular     Neuro/Psych    Depression    negative neurological ROS  negative psych ROS   GI/Hepatic negative GI ROS, Neg liver ROS,,,  Endo/Other  negative endocrine ROS  Morbid obesity  Renal/GU negative Renal ROS  negative genitourinary   Musculoskeletal   Abdominal  (+) + obese  Peds negative pediatric ROS (+)  Hematology negative hematology ROS (+)   Anesthesia Other Findings Past Medical History: No date: Hypercholesteremia No date: Hypertension  Past Surgical History: 12/05/2018: COLONOSCOPY WITH PROPOFOL; N/A     Comment:  Procedure: COLONOSCOPY WITH BIOPSIES;  Surgeon: Lucilla Lame, MD;  Location: [place];  Service:               Endoscopy;  Laterality: N/A; No date: DVIU No date: HERNIA REPAIR 12/05/2018: POLYPECTOMY; N/A     Comment:  Procedure: POLYPECTOMY;  Surgeon: Lucilla Lame, MD;                Location: [place];  Service: Endoscopy;                Laterality: N/A;  BMI    Body Mass Index: 36.35 kg/m      Reproductive/Obstetrics negative OB ROS                             Anesthesia Physical Anesthesia Plan  ASA: 2  Anesthesia Plan: General   Post-op Pain Management:    Induction: Intravenous  PONV Risk Score and Plan: Propofol infusion and TIVA  Airway Management Planned: Natural Airway  Additional Equipment:   Intra-op  Plan:   Post-operative Plan:   Informed Consent: I have reviewed the patients History and Physical, chart, labs and discussed the procedure including the risks, benefits and alternatives for the proposed anesthesia with the patient or authorized representative who has indicated his/her understanding and acceptance.     Dental Advisory Given  Plan Discussed with: CRNA and Surgeon  Anesthesia Plan Comments:        Anesthesia Quick Evaluation

## 2023-01-15 ENCOUNTER — Encounter: Payer: Self-pay | Admitting: Gastroenterology

## 2023-01-15 LAB — SURGICAL PATHOLOGY
